# Patient Record
Sex: Female | Born: 1970 | Race: Black or African American | Hispanic: No | Marital: Married | Smoking: Never smoker
Health system: Southern US, Community
[De-identification: ages and names within clinical notes are randomized; demographics above are authoritative.]

## PROBLEM LIST (undated history)

## (undated) DIAGNOSIS — E669 Obesity, unspecified: Secondary | ICD-10-CM

## (undated) DIAGNOSIS — A630 Anogenital (venereal) warts: Secondary | ICD-10-CM

## (undated) DIAGNOSIS — K219 Gastro-esophageal reflux disease without esophagitis: Secondary | ICD-10-CM

## (undated) HISTORY — PX: NERVE SURGERY: SHX1016

## (undated) HISTORY — DX: Gastro-esophageal reflux disease without esophagitis: K21.9

## (undated) HISTORY — DX: Anogenital (venereal) warts: A63.0

## (undated) HISTORY — PX: CYST REMOVAL LEG: SHX6280

## (undated) HISTORY — PX: DIAGNOSTIC LAPAROSCOPY: SUR761

## (undated) HISTORY — DX: Obesity, unspecified: E66.9

---

## 2014-06-20 ENCOUNTER — Other Ambulatory Visit (HOSPITAL_COMMUNITY)
Admission: RE | Admit: 2014-06-20 | Discharge: 2014-06-20 | Disposition: A | Discharge status: Home / Self Care | Payer: BLUE CROSS/BLUE SHIELD | Source: Ambulatory Visit | Attending: Obstetrics & Gynecology | Admitting: Obstetrics & Gynecology

## 2014-06-20 DIAGNOSIS — Z1151 Encounter for screening for human papillomavirus (HPV): Secondary | ICD-10-CM | POA: Diagnosis present

## 2014-06-20 DIAGNOSIS — Z01419 Encounter for gynecological examination (general) (routine) without abnormal findings: Secondary | ICD-10-CM | POA: Diagnosis not present

## 2014-10-01 ENCOUNTER — Other Ambulatory Visit: Payer: Self-pay | Admitting: Internal Medicine

## 2014-10-01 ENCOUNTER — Other Ambulatory Visit: Payer: Self-pay

## 2014-10-01 ENCOUNTER — Ambulatory Visit (HOSPITAL_COMMUNITY): Payer: BLUE CROSS/BLUE SHIELD | Attending: Cardiology

## 2014-10-01 DIAGNOSIS — I517 Cardiomegaly: Secondary | ICD-10-CM | POA: Insufficient documentation

## 2014-10-01 DIAGNOSIS — I071 Rheumatic tricuspid insufficiency: Secondary | ICD-10-CM | POA: Diagnosis not present

## 2014-10-01 DIAGNOSIS — R600 Localized edema: Secondary | ICD-10-CM | POA: Diagnosis present

## 2014-10-01 DIAGNOSIS — I340 Nonrheumatic mitral (valve) insufficiency: Secondary | ICD-10-CM | POA: Insufficient documentation

## 2015-04-13 ENCOUNTER — Emergency Department (HOSPITAL_COMMUNITY): Payer: No Typology Code available for payment source

## 2015-04-13 ENCOUNTER — Encounter (HOSPITAL_COMMUNITY): Payer: Self-pay

## 2015-04-13 ENCOUNTER — Emergency Department (HOSPITAL_COMMUNITY)
Admission: EM | Admit: 2015-04-13 | Discharge: 2015-04-13 | Disposition: A | Discharge status: Home / Self Care | Payer: No Typology Code available for payment source | Attending: Emergency Medicine | Admitting: Emergency Medicine

## 2015-04-13 DIAGNOSIS — S0990XA Unspecified injury of head, initial encounter: Secondary | ICD-10-CM | POA: Diagnosis present

## 2015-04-13 DIAGNOSIS — Y9389 Activity, other specified: Secondary | ICD-10-CM | POA: Diagnosis not present

## 2015-04-13 DIAGNOSIS — S199XXA Unspecified injury of neck, initial encounter: Secondary | ICD-10-CM | POA: Diagnosis not present

## 2015-04-13 DIAGNOSIS — Y92410 Unspecified street and highway as the place of occurrence of the external cause: Secondary | ICD-10-CM | POA: Insufficient documentation

## 2015-04-13 DIAGNOSIS — M542 Cervicalgia: Secondary | ICD-10-CM

## 2015-04-13 DIAGNOSIS — Y998 Other external cause status: Secondary | ICD-10-CM | POA: Insufficient documentation

## 2015-04-13 DIAGNOSIS — S0003XA Contusion of scalp, initial encounter: Secondary | ICD-10-CM

## 2015-04-13 MED ORDER — IBUPROFEN 600 MG PO TABS: 600.0000 mg | ORAL_TABLET | Freq: Four times a day (QID) | ORAL | Status: DC | PRN

## 2015-04-13 MED ORDER — HYDROCODONE-ACETAMINOPHEN 5-325 MG PO TABS: 2.0000 | ORAL_TABLET | ORAL | Status: DC | PRN

## 2015-04-13 MED ORDER — IBUPROFEN 200 MG PO TABS
400.0000 mg | ORAL_TABLET | Freq: Once | ORAL | Status: AC
  Administered 2015-04-13: 400 mg via ORAL
  Filled 2015-04-13: qty 2

## 2015-04-13 MED ORDER — HYDROCODONE-ACETAMINOPHEN 5-325 MG PO TABS
1.0000 | ORAL_TABLET | Freq: Once | ORAL | Status: AC
  Administered 2015-04-13: 1 via ORAL
  Filled 2015-04-13: qty 1

## 2015-04-13 NOTE — Discharge Instructions (Signed)
Contusion A contusion is a deep bruise. Contusions are the result of a blunt injury to tissues and muscle fibers under the skin. The injury causes bleeding under the skin. The skin overlying the contusion may turn blue, purple, or yellow. Minor injuries will give you a painless contusion, but more severe contusions may stay painful and swollen for a few weeks.  CAUSES  This condition is usually caused by a blow, trauma, or direct force to an area of the body. SYMPTOMS  Symptoms of this condition include:  Swelling of the injured area.  Pain and tenderness in the injured area.  Discoloration. The area may have redness and then turn blue, purple, or yellow. DIAGNOSIS  This condition is diagnosed based on a physical exam and medical history. An X-ray, CT scan, or MRI may be needed to determine if there are any associated injuries, such as broken bones (fractures). TREATMENT  Specific treatment for this condition depends on what area of the body was injured. In general, the best treatment for a contusion is resting, icing, applying pressure to (compression), and elevating the injured area. This is often called the RICE strategy. Over-the-counter anti-inflammatory medicines may also be recommended for pain control.  HOME CARE INSTRUCTIONS   Rest the injured area.  If directed, apply ice to the injured area:  Put ice in a plastic bag.  Place a towel between your skin and the bag.  Leave the ice on for 20 minutes, 2-3 times per day.  If directed, apply light compression to the injured area using an elastic bandage. Make sure the bandage is not wrapped too tightly. Remove and reapply the bandage as directed by your health care provider.  If possible, raise (elevate) the injured area above the level of your heart while you are sitting or lying down.  Take over-the-counter and prescription medicines only as told by your health care provider. SEEK MEDICAL CARE IF:  Your symptoms do not  improve after several days of treatment.  Your symptoms get worse.  You have difficulty moving the injured area. SEEK IMMEDIATE MEDICAL CARE IF:   You have severe pain.  You have numbness in a hand or foot.  Your hand or foot turns pale or cold.   This information is not intended to replace advice given to you by your health care provider. Make sure you discuss any questions you have with your health care provider.   Document Released: 01/19/2005 Document Revised: 12/31/2014 Document Reviewed: 08/27/2014 Elsevier Interactive Patient Education 2016 [employer] Cervical Sprain A cervical sprain is an injury in the neck in which the strong, fibrous tissues (ligaments) that connect your neck bones stretch or tear. Cervical sprains can range from mild to severe. Severe cervical sprains can cause the neck vertebrae to be unstable. This can lead to damage of the spinal cord and can result in serious nervous system problems. The amount of time it takes for a cervical sprain to get better depends on the cause and extent of the injury. Most cervical sprains heal in 1 to 3 weeks. CAUSES  Severe cervical sprains may be caused by:   Contact sport injuries (such as from football, rugby, wrestling, hockey, auto racing, gymnastics, diving, martial arts, or boxing).   Motor vehicle collisions.   Whiplash injuries. This is an injury from a sudden forward and backward whipping movement of the head and neck.  Falls.  Mild cervical sprains may be caused by:   Being in an awkward position, such as while  cradling a telephone between your ear and shoulder.   Sitting in a chair that does not offer proper support.   Working at a poorly Landscape architect station.   Looking up or down for long periods of time.  SYMPTOMS   Pain, soreness, stiffness, or a burning sensation in the front, back, or sides of the neck. This discomfort may develop immediately after the injury or slowly, 24 hours or  more after the injury.   Pain or tenderness directly in the middle of the back of the neck.   Shoulder or upper back pain.   Limited ability to move the neck.   Headache.   Dizziness.   Weakness, numbness, or tingling in the hands or arms.   Muscle spasms.   Difficulty swallowing or chewing.   Tenderness and swelling of the neck.  DIAGNOSIS  Most of the time your health care provider can diagnose a cervical sprain by taking your history and doing a physical exam. Your health care provider will ask about previous neck injuries and any known neck problems, such as arthritis in the neck. X-rays may be taken to find out if there are any other problems, such as with the bones of the neck. Other tests, such as a CT scan or MRI, may also be needed.  TREATMENT  Treatment depends on the severity of the cervical sprain. Mild sprains can be treated with rest, keeping the neck in place (immobilization), and pain medicines. Severe cervical sprains are immediately immobilized. Further treatment is done to help with pain, muscle spasms, and other symptoms and may include:  Medicines, such as pain relievers, numbing medicines, or muscle relaxants.   Physical therapy. This may involve stretching exercises, strengthening exercises, and posture training. Exercises and improved posture can help stabilize the neck, strengthen muscles, and help stop symptoms from returning.  HOME CARE INSTRUCTIONS   Put ice on the injured area.   Put ice in a plastic bag.   Place a towel between your skin and the bag.   Leave the ice on for 15-20 minutes, 3-4 times a day.   If your injury was severe, you may have been given a cervical collar to wear. A cervical collar is a two-piece collar designed to keep your neck from moving while it heals.  Do not remove the collar unless instructed by your health care provider.  If you have long hair, keep it outside of the collar.  Ask your health care  provider before making any adjustments to your collar. Minor adjustments may be required over time to improve comfort and reduce pressure on your chin or on the back of your head.  Ifyou are allowed to remove the collar for cleaning or bathing, follow your health care provider's instructions on how to do so safely.  Keep your collar clean by wiping it with mild soap and water and drying it completely. If the collar you have been given includes removable pads, remove them every 1-2 days and hand wash them with soap and water. Allow them to air dry. They should be completely dry before you wear them in the collar.  If you are allowed to remove the collar for cleaning and bathing, wash and dry the skin of your neck. Check your skin for irritation or sores. If you see any, tell your health care provider.  Do not drive while wearing the collar.   Only take over-the-counter or prescription medicines for pain, discomfort, or fever as directed by your health care  provider.   Keep all follow-up appointments as directed by your health care provider.   Keep all physical therapy appointments as directed by your health care provider.   Make any needed adjustments to your workstation to promote good posture.   Avoid positions and activities that make your symptoms worse.   Warm up and stretch before being active to help prevent problems.  SEEK MEDICAL CARE IF:   Your pain is not controlled with medicine.   You are unable to decrease your pain medicine over time as planned.   Your activity level is not improving as expected.  SEEK IMMEDIATE MEDICAL CARE IF:   You develop any bleeding.  You develop stomach upset.  You have signs of an allergic reaction to your medicine.   Your symptoms get worse.   You develop new, unexplained symptoms.   You have numbness, tingling, weakness, or paralysis in any part of your body.  MAKE SURE YOU:   Understand these instructions.  Will  watch your condition.  Will get help right away if you are not doing well or get worse.   This information is not intended to replace advice given to you by your health care provider. Make sure you discuss any questions you have with your health care provider.   Document Released: 02/06/2007 Document Revised: 04/16/2013 Document Reviewed: 10/17/2012 Elsevier Interactive Patient Education [employer].
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
## 2015-04-13 NOTE — ED Notes (Signed)
Patient currently at xray at this time.
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
## 2015-08-19 ENCOUNTER — Ambulatory Visit: Payer: Self-pay | Admitting: Primary Care

## 2015-08-26 ENCOUNTER — Ambulatory Visit (INDEPENDENT_AMBULATORY_CARE_PROVIDER_SITE_OTHER): Payer: Managed Care, Other (non HMO) | Admitting: Primary Care

## 2015-08-26 ENCOUNTER — Encounter: Payer: Self-pay | Admitting: Primary Care

## 2015-08-26 VITALS — BP 112/68 | HR 65 | Temp 98.2°F | Ht 66.0 in | Wt 231.0 lb

## 2015-08-26 DIAGNOSIS — R21 Rash and other nonspecific skin eruption: Secondary | ICD-10-CM

## 2015-08-26 DIAGNOSIS — E669 Obesity, unspecified: Secondary | ICD-10-CM | POA: Diagnosis not present

## 2015-08-26 MED ORDER — TRIAMCINOLONE ACETONIDE 0.1 % EX CREA: 1.0000 "application " | TOPICAL_CREAM | Freq: Two times a day (BID) | CUTANEOUS | Status: DC

## 2015-08-26 NOTE — Patient Instructions (Addendum)
It is important that you improve your diet. Please limit carbohydrates in the form of white bread, rice, pasta, fried foods, sweets, sugary drinks, etc. Increase your consumption of fresh fruits and vegetables, lean protein.  Ensure you are consuming 64 ounces of water daily.  Start exercising. You should be getting 1 hour of moderate intensity exercise 5 days weekly.  Apply the Triamcinolone cream twice daily as needed for rash. Do not apply to your face.   Please schedule a physical with me within the next 3 months. You may also schedule a lab only appointment 3-4 days prior. We will discuss your lab results in detail during your physical.  It was a pleasure to meet you today! Please don't hesitate to call me with any questions. Welcome to [facility]!

## 2015-08-26 NOTE — Progress Notes (Signed)
Pre visit review using our clinic review tool, if applicable. No additional management support is needed unless otherwise documented below in the visit note. 

## 2015-08-26 NOTE — Assessment & Plan Note (Signed)
Struggled with for most of her life. Poor diet and does not exercise. Discussed that I do not prescribe weight loss meds and provided her with names of places that do. Recommendations and handouts provided today in regards to healthy diet and exercise. Will continue to monitor.

## 2015-08-26 NOTE — Assessment & Plan Note (Signed)
Located to left lower extremity intermittently x 1 year, also dry scalp.  No improvement with OTC. RX for Triamcinolone cream sent to pharmacy for BID application.  Will continue to monitor.

## 2015-08-26 NOTE — Progress Notes (Signed)
Subjective:    Patient ID: Alicia Barton, female    DOB: April 07, 1971, 45 y.o.   MRN: [id number]  HPI  [name] is a 45 year old female who presents today to establish care and discuss the problems mentioned below. Will obtain old records. Her last physical in Spring 2016.  1) Obesity: Struggled with her weight her entire life. She's taken diet pills and drops in the past with temporary improvement until regimen is complete. She has gained her weight back each time.  She endorses a healthy diet: Breakfast: Pancake, bacon, egg Lunch: Tuna and crackers, sandwich Dinner: Pasta, salads, fried chicken, rice, mac and cheese, vegetable Snacks: Chips, cookies, candies, pretzels, fast food Desserts: Daily Beverages: Water, sweet tea  Exercise: She is currently not exercising  2) Rash: Intermittent for the past 1 year. Located to the left lower extremity and is itchy in nature. Denies scaling, open wounds, swelling. She does have a dry scalp.   3) Hyperlipidemia: Prior history of several years ago. She's never been managed on medication. Her last lipid panel was normal 1 year ago.   4) Back Pain: Located to left lower back. Present intermittently for years with radiation of pain down her left lower extremity. Worse with weight weight gain. She underwent MRI several years ago which was normal per patient. Also with left lower extremity swelling. She's had work up for left lower extremity swelling which was inconclusive.   Review of Systems  Constitutional: Negative for unexpected weight change.  Respiratory: Negative for shortness of breath.   Cardiovascular: Negative for chest pain.  Gastrointestinal:       Occasional reflux  Genitourinary:       Irregular periods  Skin: Positive for rash.  Neurological: Negative for dizziness and headaches.       Past Medical History  Diagnosis Date  . GERD (gastroesophageal reflux disease)   . Hyperlipidemia   . Genital warts      Social  History   Social History  . Marital Status: Married    Spouse Name: N/A  . Number of Children: N/A  . Years of Education: N/A   Occupational History  . Not on file.   Social History Main Topics  . Smoking status: Never Smoker   . Smokeless tobacco: Not on file  . Alcohol Use: 0.0 oz/week    0 Standard drinks or equivalent per week     Comment: socially  . Drug Use: No  . Sexual Activity: Not on file   Other Topics Concern  . Not on file   Social History Narrative   Married.   2 daughters   Works as a Corporate treasurer.   Enjoys shopping, being outdoors.     Past Surgical History  Procedure Laterality Date  . Cesarean section    . Cyst removal leg      R lower  . R arm pinched nerve     . Abdominal surgery      ileus    Family History  Problem Relation Age of Onset  . Arthritis Mother   . Hyperlipidemia Mother   . Diabetes Mother   . Emphysema Father     No Known Allergies  No current outpatient prescriptions on file prior to visit.   No current facility-administered medications on file prior to visit.    BP 112/68 mmHg  Pulse 65  Temp(Src) 98.2 F (36.8 C) (Oral)  Ht 5' 6"  (1.676 m)  Wt 231 lb (104.781 kg)  BMI 37.30  kg/m2  SpO2 98%  LMP 08/01/2015    Objective:   Physical Exam  Constitutional: She appears well-nourished.  Neck: Neck supple.  Cardiovascular: Normal rate and regular rhythm.   Pulmonary/Chest: Effort normal and breath sounds normal.  Skin: Skin is warm and dry.  Psychiatric: She has a normal mood and affect.          Assessment & Plan:

## 2015-11-25 ENCOUNTER — Encounter: Payer: Self-pay | Admitting: Primary Care

## 2015-11-25 ENCOUNTER — Ambulatory Visit (INDEPENDENT_AMBULATORY_CARE_PROVIDER_SITE_OTHER): Payer: Managed Care, Other (non HMO) | Admitting: Primary Care

## 2015-11-25 VITALS — BP 118/74 | HR 72 | Temp 97.5°F | Ht 66.0 in | Wt 236.4 lb

## 2015-11-25 DIAGNOSIS — E669 Obesity, unspecified: Secondary | ICD-10-CM | POA: Diagnosis not present

## 2015-11-25 DIAGNOSIS — Z0000 Encounter for general adult medical examination without abnormal findings: Secondary | ICD-10-CM | POA: Insufficient documentation

## 2015-11-25 DIAGNOSIS — Z1239 Encounter for other screening for malignant neoplasm of breast: Secondary | ICD-10-CM

## 2015-11-25 LAB — COMPREHENSIVE METABOLIC PANEL
ALT: 11 U/L (ref 0–35)
AST: 14 U/L (ref 0–37)
Albumin: 4.1 g/dL (ref 3.5–5.2)
Alkaline Phosphatase: 40 U/L (ref 39–117)
BUN: 9 mg/dL (ref 6–23)
CALCIUM: 9.5 mg/dL (ref 8.4–10.5)
CHLORIDE: 101 meq/L (ref 96–112)
CO2: 28 mEq/L (ref 19–32)
Creatinine, Ser: 0.71 mg/dL (ref 0.40–1.20)
GFR: 115.31 mL/min (ref >60.00)
Glucose, Bld: 83 mg/dL (ref 70–99)
POTASSIUM: 3.8 meq/L (ref 3.5–5.1)
SODIUM: 135 meq/L (ref 135–145)
Total Bilirubin: 1 mg/dL (ref 0.2–1.2)
Total Protein: 7.6 g/dL (ref 6.0–8.3)

## 2015-11-25 LAB — VITAMIN D 25 HYDROXY (VIT D DEFICIENCY, FRACTURES): VITD: 17.06 ng/mL — ABNORMAL LOW (ref 30.00–100.00)

## 2015-11-25 LAB — LIPID PANEL
CHOL/HDL RATIO: 3
Cholesterol: 158 mg/dL (ref 0–200)
HDL: 52.6 mg/dL (ref >39.00)
LDL Cholesterol: 75 mg/dL (ref 0–99)
NONHDL: 105.02
Triglycerides: 149 mg/dL (ref 0.0–149.0)
VLDL: 29.8 mg/dL (ref 0.0–40.0)

## 2015-11-25 LAB — HEMOGLOBIN A1C: Hgb A1c MFr Bld: 5.7 % (ref 4.6–6.5)

## 2015-11-25 LAB — CBC
HCT: 38.3 % (ref 36.0–46.0)
Hemoglobin: 12.5 g/dL (ref 12.0–15.0)
MCHC: 32.6 g/dL (ref 30.0–36.0)
MCV: 83.8 fl (ref 78.0–100.0)
Platelets: 240 10*3/uL (ref 150.0–400.0)
RBC: 4.57 Mil/uL (ref 3.87–5.11)
RDW: 13.6 % (ref 11.5–15.5)
WBC: 5.9 10*3/uL (ref 4.0–10.5)

## 2015-11-25 LAB — TSH: TSH: 1.33 u[IU]/mL (ref 0.35–4.50)

## 2015-11-25 NOTE — Progress Notes (Signed)
Subjective:    Patient ID: Alicia Barton, female    DOB: 09-30-1970, 45 y.o.   MRN: [id number]  HPI  Ms. Bynum is a 45 year old female who presents today for complete physical.  Immunizations: -Tetanus: Completed in Summer 2016 -Influenza: Did not complete last season  Diet: She endorses a poor diet. Breakfast: Egg, toast, cereal, applesauce Lunch: Tuna, crackers, peanut butter sandwich Dinner: Fast food, cereal, pizza Snacks: Chips, cakes, candy, chocolate Desserts: Daily Beverages: Water, occasional soda  Exercise: She does not currently exercise Eye exam: Completed in 2016, no changes in vision Dental exam: Completes semi-annually Pap Smear: Completed in 2016, normal Mammogram: Has not completed in several years.    Review of Systems  Constitutional: Negative for unexpected weight change.  HENT: Negative for rhinorrhea.   Respiratory: Negative for cough and shortness of breath.   Cardiovascular: Negative for chest pain.  Gastrointestinal: Negative for constipation and diarrhea.  Genitourinary: Negative for difficulty urinating.       Irregular periods for years.  Musculoskeletal: Negative for arthralgias and myalgias.       Lower back pain when waking in the morning  Skin: Negative for rash.  Allergic/Immunologic: Negative for environmental allergies.  Neurological: Negative for dizziness, numbness and headaches.  Psychiatric/Behavioral:       Denies concerns for anxiety or depression       Past Medical History:  Diagnosis Date  . Genital warts   . GERD (gastroesophageal reflux disease)   . Hyperlipidemia   . Obesity (BMI 35.0-39.9 without comorbidity) ([place])      Social History   Social History  . Marital status: Married    Spouse name: N/A  . Number of children: N/A  . Years of education: N/A   Occupational History  . Not on file.   Social History Main Topics  . Smoking status: Never Smoker  . Smokeless tobacco: Not on file  . Alcohol use 0.0  oz/week     Comment: socially  . Drug use: No  . Sexual activity: Not on file   Other Topics Concern  . Not on file   Social History Narrative   Married.   2 daughters   Works as a Corporate treasurer.   Enjoys shopping, being outdoors.     Past Surgical History:  Procedure Laterality Date  . ABDOMINAL SURGERY     ileus  . CESAREAN SECTION    . CYST REMOVAL LEG     R lower  . R arm pinched nerve       Family History  Problem Relation Age of Onset  . Arthritis Mother   . Hyperlipidemia Mother   . Diabetes Mother   . Emphysema Father     No Known Allergies  No current outpatient prescriptions on file prior to visit.   No current facility-administered medications on file prior to visit.     BP 118/74   Pulse 72   Temp 97.5 F (36.4 C) (Oral)   Ht 5' 6"  (1.676 m)   Wt 236 lb 6.4 oz (107.2 kg)   LMP 10/31/2015   SpO2 95%   BMI 38.16 kg/m    Objective:   Physical Exam  Constitutional: She is oriented to person, place, and time. She appears well-nourished.  HENT:  Right Ear: Tympanic membrane and ear canal normal.  Left Ear: Tympanic membrane and ear canal normal.  Nose: Nose normal.  Mouth/Throat: Oropharynx is clear and moist.  Eyes: Conjunctivae and EOM are normal. Pupils are equal,  round, and reactive to light.  Neck: Neck supple. No thyromegaly present.  Cardiovascular: Normal rate and regular rhythm.   No murmur heard. Mild left lower extremity/ankle edema. No pitting.  Pulmonary/Chest: Effort normal and breath sounds normal. She has no rales.  Abdominal: Soft. Bowel sounds are normal. There is no tenderness.  Musculoskeletal: Normal range of motion.  Negative straight leg raise. Good range of motion to lower back bilaterally.  Lymphadenopathy:    She has no cervical adenopathy.  Neurological: She is alert and oriented to person, place, and time. She has normal reflexes. No cranial nerve deficit.  Skin: Skin is warm and dry. No rash noted.  Psychiatric: She has  a normal mood and affect.          Assessment & Plan:

## 2015-11-25 NOTE — Progress Notes (Signed)
Pre visit review using our clinic review tool, if applicable. No additional management support is needed unless otherwise documented below in the visit note. 

## 2015-11-25 NOTE — Assessment & Plan Note (Signed)
Poor diet and does not exercise. Discussed the importance of a healthy diet and regular exercise in order for weight loss and to reduce risk of other medical diseases.

## 2015-11-25 NOTE — Assessment & Plan Note (Signed)
Immunizations up-to-date. Pap up-to-date per patient. Mammogram due and ordered today. Exam unremarkable. Labs pending. Discussed the importance of a healthy diet and regular exercise in order for weight loss and to reduce risk of other medical diseases.  Follow-up in one year for repeat physical.

## 2015-11-25 NOTE — Patient Instructions (Signed)
Complete lab work prior to leaving today. I will notify you of your results once received.   I placed the order for your mammogram at [facility] through [facility].  It is important that you improve your diet. Please limit carbohydrates in the form of white bread, rice, pasta, cakes, fast food, sugary drinks, etc. Increase your consumption of fresh fruits and vegetables, whole grains, lean protein.  Ensure you are consuming 64 ounces of water daily.  Start exercising. You should be getting 1 hour of moderate intensity exercise 3-4 days weekly.  Follow up in 1 year for repeat physical or sooner if needed.  It was a pleasure to see you today!

## 2015-11-26 ENCOUNTER — Encounter: Payer: Self-pay | Admitting: *Deleted

## 2017-05-08 ENCOUNTER — Encounter: Payer: Self-pay | Admitting: Occupational Therapy

## 2017-05-08 ENCOUNTER — Other Ambulatory Visit: Payer: Self-pay

## 2017-05-08 ENCOUNTER — Ambulatory Visit: Payer: Self-pay | Attending: Orthopedic Surgery | Admitting: Occupational Therapy

## 2017-05-08 DIAGNOSIS — M79641 Pain in right hand: Secondary | ICD-10-CM

## 2017-05-08 DIAGNOSIS — R600 Localized edema: Secondary | ICD-10-CM

## 2017-05-08 DIAGNOSIS — M25641 Stiffness of right hand, not elsewhere classified: Secondary | ICD-10-CM

## 2017-05-08 DIAGNOSIS — M6281 Muscle weakness (generalized): Secondary | ICD-10-CM
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
## 2017-05-08 NOTE — Patient Instructions (Signed)
Contrast  AROM for R thumb PA and RA AROM block IP flexion  Gentle composite PROM for thumb  Opposition AROM with end range gentle stretch  10 reps each  Pain to keep under 2/10 and slight pull   Thumb CMC neoprene fitted on pt to use during day  And compression digi sleeve for night time use

## 2017-05-11 ENCOUNTER — Ambulatory Visit: Payer: Self-pay | Admitting: Occupational Therapy

## 2017-05-11 DIAGNOSIS — M79641 Pain in right hand: Secondary | ICD-10-CM

## 2017-05-11 DIAGNOSIS — M6281 Muscle weakness (generalized): Secondary | ICD-10-CM

## 2017-05-11 DIAGNOSIS — R600 Localized edema: Secondary | ICD-10-CM

## 2017-05-11 DIAGNOSIS — M25641 Stiffness of right hand, not elsewhere classified: Secondary | ICD-10-CM

## 2017-05-11 NOTE — Therapy (Signed)
[place] [facility] [address] [place], [address] Phone: [phone]   Fax:  [phone]  Occupational Therapy Treatment  Patient Details  Name: Alicia Barton MRN: [id number] Date of Birth: December 08, 1970 Referring Provider: Rudene Christians   Encounter Date: 05/11/2017  OT End of Session - 05/11/17 0957    Visit Number  2    Number of Visits  6    Date for OT Re-Evaluation  06/12/17    OT Start Time  0955    OT Stop Time  1028    OT Time Calculation (min)  33 min    Activity Tolerance  Patient tolerated treatment well    Behavior During Therapy  [facility] for tasks assessed/performed       Past Medical History:  Diagnosis Date  . Genital warts   . GERD (gastroesophageal reflux disease)   . Hyperlipidemia   . Obesity (BMI 35.0-39.9 without comorbidity)     Past Surgical History:  Procedure Laterality Date  . ABDOMINAL SURGERY     ileus  . CESAREAN SECTION    . CYST REMOVAL LEG     R lower  . R arm pinched nerve       There were no vitals filed for this visit.  Subjective Assessment - 05/11/17 0948    Subjective   Since last night had more pain - exercises doing okay - using it more - sleep with soft splint and furing day using my hand     Patient Stated Goals  I want to get the use of my R thumb back so I can go back to work , do buttons, tie shoes, do my and my daughters hair, pick up or lift objects     Currently in Pain?  Yes    Pain Score  3     Pain Location  Finger (Comment which one)    Pain Orientation  Right    Pain Descriptors / Indicators  Aching    Pain Type  Acute pain    Pain Onset  More than a month ago         [facility] OT Assessment - 05/11/17 0001      Edema   Edema  0.6 increase      Right Hand AROM   R Thumb MCP 0-60  40 Degrees    R Thumb IP 0-80  40 Degrees    R Thumb Radial ABduction/ADduction 0-55  56    R Thumb Palmar ABduction/ADduction 0-45  54    R Thumb Opposition to Index  --  Opposition to distal fold of 5th                OT Treatments/Exercises (OP) - 05/11/17 0001      Ultrasound   Ultrasound Location  thumb IP and MP     Ultrasound Parameters  3.3MHZ , 20% , .8 intensity     Ultrasound Goals  Edema;Pain      RUE Fluidotherapy   Number Minutes Fluidotherapy  10 Minutes    RUE Fluidotherapy Location  Hand    Comments  At [facility] to decrease pain and increase ROM        Assess active range of motion of right thumb IP and MP flexion and opposition  Pt cont to have some pain at thumb 2-3/10 after fluidotherapy pain decrease and increase range of motion review with patients home exercises for blocked active range of motion IP thumb, and MP Pt  to cont with same HEP but a gentle passive range of motion with Oppostion to 5th  review and added to home exercises for isometric for thumb PA and RA, flexion and extention in neutral position  Use neoprene CMC thumb splint during act to decrease pain  Do contrast prior to HEP and ice several times           OT Education - 05/11/17 2011    Education provided  Yes       OT Short Term Goals - 05/08/17 1145      OT SHORT TERM GOAL #1   Title  Pain on PRWHE improve with 20 points     Baseline  Pain at eval on PRWHE is 33/50    Time  3    Period  Weeks    Status  New    Target Date  05/29/17      OT SHORT TERM GOAL #2   Title  R thumb AROM improve for pt to touch palm to retrieve  1-2 cm objects out of palm     Baseline  see flowsheet for AROM of thumb     Time  3    Period  Weeks    Status  New    Target Date  05/29/17      OT SHORT TERM GOAL #3   Title  Pt to be ind in HEP to decrease edema and pain - and increase ROM to return to work     Baseline  no knowledge on HEP - edema increase 1 cm     Time  3    Period  Weeks    Status  New    Target Date  05/29/17        OT Long Term Goals - 05/08/17 1147      OT LONG TERM GOAL #1   Title  AROM WFL  and pain decrease for pt to initiate  strengthening to be able to do buttons, tie shoes and do hair    Baseline  pain increase 6/10 , AROM decrease - and not able to put pressure on thumb     Time  4    Period  Weeks    Status  New    Target Date  06/05/17      OT LONG TERM GOAL #2   Title  R prehension and grip strength increase to more than 50% compare to L hand     Baseline  NA yet- 6 wks -only 1 wk out of thumb spica -and pain increase 6/10 with pressure or use     Time  5    Period  Weeks    Status  New    Target Date  06/12/17            Plan - 05/11/17 0957    Clinical Impression Statement  Pt made progress in AROM of R  thumb and use - but still has pain if over do things , and cont to wear neoprene splint with act that cause pain - gentle on PROM - did add this date isometric strengnth  in painfree range - pt cont to increase ROM , use but decrease pain too     Occupational performance deficits (Please refer to evaluation for details):  ADL's;IADL's;Work;Play;Leisure    Rehab Potential  Good    OT Frequency  2x / week    OT Duration  4 weeks    OT Treatment/Interventions  Self-care/ADL training;Fluidtherapy;Splinting;Contrast Bath;Therapeutic  exercise;Ultrasound;Paraffin;Manual Therapy;Passive range of motion    Plan  assess if pain decrease - AROM progresing and if can do more strength     Clinical Decision Making  Several treatment options, min-mod task modification necessary    OT Home Exercise Plan  see pt instruction     Consulted and Agree with Plan of Care  Patient       Patient will benefit from skilled therapeutic intervention in order to improve the following deficits and impairments:  Pain, Impaired flexibility, Increased edema, Decreased coordination, Decreased strength, Decreased range of motion, Impaired UE functional use  Visit Diagnosis: Pain in right hand  Localized edema  Stiffness of right hand, not elsewhere classified  Muscle weakness (generalized)    Problem List Patient  Active Problem List   Diagnosis Date Noted  . Preventative health care 11/25/2015  . Rash and nonspecific skin eruption 08/26/2015  . Obesity (BMI 35.0-39.9 without comorbidity) 08/26/2015    Rosalyn Gess OTR/L,CLT 05/11/2017, 8:15 PM  [place] [facility] [address] [place], [address] Phone: [phone]   Fax:  [phone]  Name: Alicia Barton MRN: [id number] Date of Birth: 1970-07-26

## 2017-05-11 NOTE — Patient Instructions (Addendum)
cont at home  Contrast   AROM for thumb PA , RA , blocked AROM IP flexion  AAROM thumb composite flexion  Oppposition to all digits - AAROM to 2nd fold of opposition  CMC neoprene splint wear during act that increase pain   digisleeve for night time to decrease edema  Wear if it decrease pain  Add isometric thumb in neutral Flexion, extention, , PA and RA  10 reps pain free 2 sets  Ice massage and ice as needed

## 2017-05-17 ENCOUNTER — Ambulatory Visit: Payer: Self-pay | Admitting: Occupational Therapy

## 2017-05-17 DIAGNOSIS — R600 Localized edema: Secondary | ICD-10-CM

## 2017-05-17 DIAGNOSIS — M25641 Stiffness of right hand, not elsewhere classified: Secondary | ICD-10-CM

## 2017-05-17 DIAGNOSIS — M79641 Pain in right hand: Secondary | ICD-10-CM

## 2017-05-17 DIAGNOSIS — M6281 Muscle weakness (generalized): Secondary | ICD-10-CM

## 2017-05-17 NOTE — Therapy (Signed)
[place] [facility] [address] [place], [address] Phone: [phone]   Fax:  [phone]  Occupational Therapy Treatment  Patient Details  Name: Alicia Barton MRN: [id number] Date of Birth: March 30, 1971 Referring Provider: Rudene Christians   Encounter Date: 05/17/2017  OT End of Session - 05/17/17 0932    Visit Number  3    Number of Visits  6    Date for OT Re-Evaluation  06/12/17    OT Start Time  0918    OT Stop Time  1001    OT Time Calculation (min)  43 min    Activity Tolerance  Patient tolerated treatment well    Behavior During Therapy  [facility] for tasks assessed/performed       Past Medical History:  Diagnosis Date  . Genital warts   . GERD (gastroesophageal reflux disease)   . Hyperlipidemia   . Obesity (BMI 35.0-39.9 without comorbidity)     Past Surgical History:  Procedure Laterality Date  . ABDOMINAL SURGERY     ileus  . CESAREAN SECTION    . CYST REMOVAL LEG     R lower  . R arm pinched nerve       There were no vitals filed for this visit.  Subjective Assessment - 05/17/17 0928    Subjective   Pain is better, did wear the soft splint during day about 80% of time - I think swelling is little better- can use my hand better - like bathing and hygiene    Patient Stated Goals  I want to get the use of my R thumb back so I can go back to work , do buttons, tie shoes, do my and my daughters hair, pick up or lift objects     Currently in Pain?  No/denies         [facility] OT Assessment - 05/17/17 0001      Strength   Right Hand Grip (lbs)  22    Left Hand Grip (lbs)  85      Right Hand AROM   R Thumb IP 0-80  45 Degrees    R Thumb Radial ABduction/ADduction 0-55  55 no pain     R Thumb Palmar ABduction/ADduction 0-45  55 no pain                OT Treatments/Exercises (OP) - 05/17/17 0001      RUE Fluidotherapy   Number Minutes Fluidotherapy  10 Minutes    RUE Fluidotherapy Location   Hand    Comments  at [facility] to decrease stiffness and decrease pain        Assess active range of motion of right thumb IP and MP flexion and opposition  PA and RA of thumb   Pt cont to have some pain at thumb 2-3/10 with pressure or use  after fluidotherapy pain decrease and increase range of motion  review with patients home exercises for blocked active range of motion IP thumb, and MP - can add gentle PROM to thumb IP  Increase 10 degrees    review  isometric for thumb PA and RA, flexion and extention in neutral position   pt to stop and do rubber band for Thumb PA and RA 10 reps  Light blue easy putty - for gripping done this date- no pain - upgrade to teal - had some pain but it was after about 20-25 reps   pt to do light blue for 3  days - and if no pain can do teal - 12 reps  2 x day  Use neoprene CMC thumb splint during act to decrease pain  And try digi compression sleeve for thumb again to decrease edema - was increase .8 cm this date  Do contrast prior to HEP and ice several times    add gentle PROM for wrist flexion and exention appear to have full motion but report pull at end range and after putty      OT Education - 05/17/17 0931    Education provided  Yes    Education Details  HEP -  strengthening for thumb and grip     Person(s) Educated  Patient    Methods  Explanation;Demonstration;Tactile cues;Verbal cues    Comprehension  Verbalized understanding;Returned demonstration;Verbal cues required       OT Short Term Goals - 05/08/17 1145      OT SHORT TERM GOAL #1   Title  Pain on PRWHE improve with 20 points     Baseline  Pain at eval on PRWHE is 33/50    Time  3    Period  Weeks    Status  New    Target Date  05/29/17      OT SHORT TERM GOAL #2   Title  R thumb AROM improve for pt to touch palm to retrieve  1-2 cm objects out of palm     Baseline  see flowsheet for AROM of thumb     Time  3    Period  Weeks    Status  New    Target Date  05/29/17       OT SHORT TERM GOAL #3   Title  Pt to be ind in HEP to decrease edema and pain - and increase ROM to return to work     Baseline  no knowledge on HEP - edema increase 1 cm     Time  3    Period  Weeks    Status  New    Target Date  05/29/17        OT Long Term Goals - 05/08/17 1147      OT LONG TERM GOAL #1   Title  AROM WFL  and pain decrease for pt to initiate strengthening to be able to do buttons, tie shoes and do hair    Baseline  pain increase 6/10 , AROM decrease - and not able to put pressure on thumb     Time  4    Period  Weeks    Status  New    Target Date  06/05/17      OT LONG TERM GOAL #2   Title  R prehension and grip strength increase to more than 50% compare to L hand     Baseline  NA yet- 6 wks -only 1 wk out of thumb spica -and pain increase 6/10 with pressure or use     Time  5    Period  Weeks    Status  New    Target Date  06/12/17            Plan - 05/17/17 0933    Clinical Impression Statement  Pt's pain was decrease this date and report increase use - AROM in thumb improve this date in session and was able to add strengthenign but pt still with .8 cm edema in thumb - to try more compression -     Occupational performance deficits (Please  refer to evaluation for details):  ADL's;IADL's;Work;Play;Leisure    Rehab Potential  Good    OT Frequency  2x / week    OT Treatment/Interventions  Self-care/ADL training;Fluidtherapy;Splinting;Contrast Bath;Therapeutic exercise;Ultrasound;Paraffin;Manual Therapy;Passive range of motion    Clinical Decision Making  Several treatment options, min-mod task modification necessary    OT Home Exercise Plan  see pt instruction     Consulted and Agree with Plan of Care  Patient       Patient will benefit from skilled therapeutic intervention in order to improve the following deficits and impairments:  Pain, Impaired flexibility, Increased edema, Decreased coordination, Decreased strength, Decreased range of motion,  Impaired UE functional use  Visit Diagnosis: Pain in right hand  Localized edema  Stiffness of right hand, not elsewhere classified  Muscle weakness (generalized)    Problem List Patient Active Problem List   Diagnosis Date Noted  . Preventative health care 11/25/2015  . Rash and nonspecific skin eruption 08/26/2015  . Obesity (BMI 35.0-39.9 without comorbidity) 08/26/2015    Rosalyn Gess OTR/L,CLT 05/17/2017, 3:29 PM  [place] [facility] [address] [place], [address] Phone: [phone]   Fax:  [phone]  Name: Mansi Tokar MRN: [id number] Date of Birth: [birth year]

## 2017-05-17 NOTE — Patient Instructions (Addendum)
Same HEP but add     do rubber band for Thumb PA and RA 10 reps  Light blue easy putty - for gripping done this date- no pain - upgrade to teal - had some pain but it was after about 20-25 reps   pt to do light blue for 3 days - and if no pain can do teal - 12 reps  2 x day  Use neoprene CMC thumb splint during act to decrease pain  And try digi compression sleeve for thumb again to decrease edema - was increase .8 cm this date  Do contrast prior to HEP and ice several times

## 2017-05-24 ENCOUNTER — Ambulatory Visit: Payer: Self-pay | Admitting: Occupational Therapy

## 2017-05-24 DIAGNOSIS — M25641 Stiffness of right hand, not elsewhere classified: Secondary | ICD-10-CM

## 2017-05-24 DIAGNOSIS — R600 Localized edema: Secondary | ICD-10-CM

## 2017-05-24 DIAGNOSIS — M79641 Pain in right hand: Secondary | ICD-10-CM

## 2017-05-24 DIAGNOSIS — M6281 Muscle weakness (generalized): Secondary | ICD-10-CM

## 2017-05-24 NOTE — Therapy (Signed)
[place] [facility] [address], [place], [address] Phone: [phone]   Fax:  [phone]  Occupational Therapy Treatment  Patient Details  Name: Merion Caton MRN: [id number] Date of Birth: 1971/03/02 Referring Provider: Rudene Christians   Encounter Date: 05/24/2017  OT End of Session - 05/24/17 0936    Visit Number  4    Number of Visits  6    Date for OT Re-Evaluation  06/12/17    OT Start Time  0920    OT Stop Time  1000    OT Time Calculation (min)  40 min    Activity Tolerance  Patient tolerated treatment well    Behavior During Therapy  [facility] for tasks assessed/performed       Past Medical History:  Diagnosis Date  . Genital warts   . GERD (gastroesophageal reflux disease)   . Hyperlipidemia   . Obesity (BMI 35.0-39.9 without comorbidity)     Past Surgical History:  Procedure Laterality Date  . ABDOMINAL SURGERY     ileus  . CESAREAN SECTION    . CYST REMOVAL LEG     R lower  . R arm pinched nerve       There were no vitals filed for this visit.  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  No/denies         [facility] OT Assessment - 05/24/17 0001      Strength   Right Hand Grip (lbs)  51    Left Hand Grip (lbs)  85      Right Hand AROM   R Thumb IP 0-80  40 Degrees       Assess AROM for thumb and grip strength - grip increase significantly - did not address prehension  strength yet - to much pain wit pressure  But this date did dexterity with pt - thumb <> fingers act - like cards Tripod grip and walk back and forth on cylinder objects Pick up small object of 1 cm - the 2 point grip - and move objects to palm  - up to 3 and release one at time to 2 point grip  Add to HEP  Pt to cont with contrast and  Done blocked AROM for IP and MP of thumb  And opposition to 2nd fold of 5th   pt hold of on putty  And increase use functionally -and simulate work act   pt to try on suction for trachea pt's block hole with 2nd digit  get spring loaded scissors and Penagain to decrease pressure on thumb and decrease pain           OT Treatments/Exercises (OP) - 05/24/17 0001      RUE  Paraffin   Number Minutes Paraffin  10 Minutes    RUE Paraffin Location  Hand    Comments  decrease pain after measurements              OT Education - 05/24/17 1817    Education provided  Yes    Education Details  HEP change -add dexterity     Methods  Explanation;Demonstration;Tactile cues;Verbal cues;Handout    Comprehension  Verbalized understanding;Returned demonstration       OT Short Term Goals - 05/08/17 1145      OT SHORT TERM GOAL #1   Title  Pain on PRWHE improve with 20 points     Baseline  Pain at eval on PRWHE is 33/50    Time  3    Period  Weeks    Status  New    Target Date  05/29/17      OT SHORT TERM GOAL #2   Title  R thumb AROM improve for pt to touch palm to retrieve  1-2 cm objects out of palm     Baseline  see flowsheet for AROM of thumb     Time  3    Period  Weeks    Status  New    Target Date  05/29/17      OT SHORT TERM GOAL #3   Title  Pt to be ind in HEP to decrease edema and pain - and increase ROM to return to work     Baseline  no knowledge on HEP - edema increase 1 cm     Time  3    Period  Weeks    Status  New    Target Date  05/29/17        OT Long Term Goals - 05/08/17 1147      OT LONG TERM GOAL #1   Title   AROM WFL  and pain decrease for pt to initiate strengthening to be able to do buttons, tie shoes and do hair    Baseline  pain increase 6/10 , AROM decrease - and not able to put pressure on thumb     Time  4    Period  Weeks    Status  New    Target Date  06/05/17      OT LONG TERM GOAL #2   Title  R prehension and grip strength increase to more than 50% compare to L hand     Baseline  NA yet- 6 wks -only 1 wk out of thumb spica -and pain increase 6/10 with pressure or use     Time  5    Period  Weeks    Status  New    Target Date  06/12/17            Plan - 05/24/17 9242    Clinical Impression Statement  Pt show increase AROM in thumb , increase grip strength and pain this date was decrease since she over did it over the weekend - pt to work until appt with MD on AROM , dexterity and keeping pain under 2/10 - and she is worried about going back to work - pt to simulate act  this week to discuss with MD Monday - 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 and all will be new ones she is not familiar with     Occupational performance deficits (Please refer to evaluation for details):  ADL's;IADL's;Work;Play;Leisure    Rehab Potential  Good  OT Frequency  2x / week    OT Duration  4 weeks    OT Treatment/Interventions  Self-care/ADL training;Fluidtherapy;Splinting;Contrast Bath;Therapeutic exercise;Ultrasound;Paraffin;Manual Therapy;Passive range of motion    Plan  check how did with dexterity HEP and ROM , strenght - without increase pain     Clinical Decision Making  Several treatment options, min-mod task modification necessary    OT Home Exercise Plan  see pt instruction     Consulted and Agree with Plan of Care  Patient       Patient will benefit from skilled therapeutic intervention in order to improve the following deficits and impairments:  Pain, Impaired flexibility, Increased edema, Decreased coordination, Decreased strength, Decreased range of motion, Impaired UE functional  use  Visit Diagnosis: Pain in right hand  Localized edema  Stiffness of right hand, not elsewhere classified  Muscle weakness (generalized)    Problem List Patient Active Problem List   Diagnosis Date Noted  . Preventative health care 11/25/2015  . Rash and nonspecific skin eruption 08/26/2015  . Obesity (BMI 35.0-39.9 without comorbidity) 08/26/2015    Rosalyn Gess OTR/L,CLT 05/24/2017, 6:20 PM  [place] [facility] [address], [place], [address] Phone: [phone]   Fax:  [phone]  Name: Tegan Britain MRN: [id number] Date of Birth: 04/28/1970

## 2017-05-24 NOTE — Patient Instructions (Signed)
Pt to only do AROM after contrast for thumb in all planes And then add this date dexterity for functional use - to increase ROM at thumb , speed  And to do functional strength for prehension strength  Pt to try and simulate act of work to discuss it with MD on MOnday

## 2017-05-31 ENCOUNTER — Ambulatory Visit: Payer: Self-pay | Attending: Orthopedic Surgery | Admitting: Occupational Therapy

## 2017-05-31 DIAGNOSIS — M6281 Muscle weakness (generalized): Secondary | ICD-10-CM

## 2017-05-31 DIAGNOSIS — M25641 Stiffness of right hand, not elsewhere classified: Secondary | ICD-10-CM

## 2017-05-31 DIAGNOSIS — R600 Localized edema: Secondary | ICD-10-CM

## 2017-05-31 DIAGNOSIS — M79641 Pain in right hand: Secondary | ICD-10-CM

## 2017-05-31 NOTE — Patient Instructions (Signed)
Pt to work only on AROM for opposition to base of 5th  AROM for thumb PA and RA  and then functional strength-   OT discuss in length with pt about modifications and adjustments for work activities  - suction , diaper changes, rolling , transfers  , NG tube feedings ,   Keep pain under 1-2/10

## 2017-05-31 NOTE — Therapy (Signed)
[place] [facility] [address], [place], [address] Phone: [phone]   Fax:  [phone]  Occupational Therapy Treatment  Patient Details  Name: Alicia Barton MRN: [id number] Date of Birth: Sep 17, 1970 Referring Provider: Rudene Christians   Encounter Date: 05/31/2017  OT End of Session - 05/31/17 1734    Visit Number  5    Number of Visits  6    Date for OT Re-Evaluation  06/12/17    OT Start Time  1000    OT Stop Time  1035    OT Time Calculation (min)  35 min    Activity Tolerance  Patient tolerated treatment well    Behavior During Therapy  [facility] for tasks assessed/performed       Past Medical History:  Diagnosis Date  . Genital warts   . GERD (gastroesophageal reflux disease)   . Hyperlipidemia   . Obesity (BMI 35.0-39.9 without comorbidity)     Past Surgical History:  Procedure Laterality Date  . ABDOMINAL SURGERY     ileus  . CESAREAN SECTION    . CYST REMOVAL LEG     R lower  . R arm pinched nerve       There were no vitals filed for this visit.  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  No/denies      Assess AROM for thumb PA and RA - 1/10 pain with RA - at end range  IP flexion block no pain this date  But tender still around IP of thumb  Opposition to 2nd fold of 5th   Dexterity report did okay with thumb <> fingers act  And retrieving objects out of palm   but had pain with just doing those light functional act - but using thumb   Pt to hold off on any PROM , strengthening , and blocked AROM   And only do    Pain free AROM for opposition to base of 5th  AROM for thumb PA and RA  and then functional strength-   OT discuss  Per pt request - worried about tasks at work    in length with pt about modifications and adjustments for work activities  - suction , diaper changes, rolling , transfers  , NG tube feedings , computer and writing   Pt to return in 10 days - can phone if any issues occur prior                  OT Education - 05/31/17 1733    Education provided  Yes    Education Details  modifications of work activities     [employer]) Educated  Patient    Methods  Explanation;Demonstration;Tactile cues    Comprehension  Returned demonstration;Verbalized understanding       OT Short Term Goals - 05/08/17 1145      OT SHORT TERM GOAL #1  Title  Pain on PRWHE improve with 20 points     Baseline  Pain at eval on PRWHE is 33/50    Time  3    Period  Weeks    Status  New    Target Date  05/29/17      OT SHORT TERM GOAL #2   Title  R thumb AROM improve for pt to touch palm to retrieve  1-2 cm objects out of palm     Baseline  see flowsheet for AROM of thumb     Time  3    Period  Weeks    Status  New    Target Date  05/29/17      OT SHORT TERM GOAL #3   Title  Pt to be ind in HEP to decrease edema and pain - and increase ROM to return to work     Baseline  no knowledge on HEP - edema increase 1 cm     Time  3    Period  Weeks    Status  New    Target Date  05/29/17        OT Long Term Goals - 05/08/17 1147      OT LONG TERM GOAL #1   Title  AROM WFL  and pain decrease for pt to initiate strengthening to be able to do buttons, tie shoes and do hair    Baseline  pain increase 6/10 , AROM decrease - and not able to put pressure on thumb     Time  4    Period  Weeks    Status  New    Target Date  06/05/17      OT LONG TERM GOAL #2   Title  R prehension and grip strength increase to more than 50% compare to L hand     Baseline  NA yet- 6  wks -only 1 wk out of thumb spica -and pain increase 6/10 with pressure or use     Time  5    Period  Weeks    Status  New    Target Date  06/12/17            Plan - 05/31/17 1735    Clinical Impression Statement  Pt arrive with no pain in thumb - pt report she started steriod yesterday - she had pain even doing FMC/dexterity act over the weekend - pt report she had more strength she felt with pain less - to turn key and lift objects- pt to return to work - discuss in length work modifications - and to work on functional strength - and no formal hand therapy this next week while returning to work  - on top of using it - will reassess in 10 days     Occupational performance deficits (Please refer to evaluation for details):  ADL's;IADL's;Work;Play;Leisure    Rehab Potential  Good    OT Frequency  Biweekly    OT Duration  4 weeks    OT Treatment/Interventions  Self-care/ADL training;Fluidtherapy;Splinting;Contrast Bath;Therapeutic exercise;Ultrasound;Paraffin;Manual Therapy;Passive range of motion    Plan  Assess how she did at work - progress and pain     Clinical Decision Making  Several treatment options, min-mod task modification necessary    OT Home Exercise Plan  see pt instruction     Consulted and Agree with Plan of Care  Patient       Patient will benefit from skilled therapeutic intervention in order to improve the  following deficits and impairments:  Pain, Impaired flexibility, Increased edema, Decreased coordination, Decreased strength, Decreased range of motion, Impaired UE functional use  Visit Diagnosis: Pain in right hand  Localized edema  Stiffness of right hand, not elsewhere classified  Muscle weakness (generalized)    Problem List Patient Active Problem List   Diagnosis Date Noted  . Preventative health care 11/25/2015  . Rash and nonspecific skin eruption 08/26/2015  . Obesity (BMI 35.0-39.9 without comorbidity) 08/26/2015    Rosalyn Gess  OTR/L,CLT 05/31/2017, 5:38 PM  [place] [facility] [address] [place], [address] Phone: [phone]   Fax:  [phone]  Name: Korina Tretter MRN: [id number] Date of Birth: 07-Sep-1970

## 2017-06-12 ENCOUNTER — Ambulatory Visit: Payer: Self-pay | Admitting: Occupational Therapy

## 2017-06-12 DIAGNOSIS — M6281 Muscle weakness (generalized): Secondary | ICD-10-CM

## 2017-06-12 DIAGNOSIS — M79641 Pain in right hand: Secondary | ICD-10-CM

## 2017-06-12 DIAGNOSIS — M25641 Stiffness of right hand, not elsewhere classified: Secondary | ICD-10-CM

## 2017-06-12 DIAGNOSIS — R600 Localized edema: Secondary | ICD-10-CM

## 2017-06-12 NOTE — Therapy (Signed)
[place] [facility] [address] [place], [address] Phone: [phone]   Fax:  [phone]  Occupational Therapy Treatment  Patient Details  Name: Alicia Barton MRN: [id number] Date of Birth: 1971-04-22 Referring Provider: Rudene Christians   Encounter Date: 06/12/2017  OT End of Session - 06/12/17 1751    Visit Number  6    Number of Visits  7    Date for OT Re-Evaluation  07/10/17    OT Start Time  1010    OT Stop Time  1044    OT Time Calculation (min)  34 min    Activity Tolerance  Patient tolerated treatment well    Behavior During Therapy  [facility] for tasks assessed/performed       Past Medical History:  Diagnosis Date  . Genital warts   . GERD (gastroesophageal reflux disease)   . Hyperlipidemia   . Obesity (BMI 35.0-39.9 without comorbidity)     Past Surgical History:  Procedure Laterality Date  . ABDOMINAL SURGERY     ileus  . CESAREAN SECTION    . CYST REMOVAL LEG     R lower  . R arm pinched nerve       There were no vitals filed for this visit.  Subjective Assessment - 06/12/17 1015    Subjective   It is better than 2 wks ago - did see pt on Friday  for 9 hrs - little one had to change traheo -and write notes - was some soreness -and then I am using my hand at home normally even - if it hurts - did my daughter hair     Patient Stated Goals  I want to get the use of my R thumb back so I can go back to work , do buttons, tie shoes, do my and my daughters hair, pick up or lift objects     Currently in Pain?  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 after writing , doing tracheo cleaning and changing  But not more than 3/10  Pt to keep pain under 2-3/10   assess AROM for thumb and grip /prehension strength  See flow sheet  Great progress in grip  Prehension less than 50% compare to R - but held off still on putty   pt work load will increase- pt to work on functional strength for prehension  And cont to  increase AROM or thumb IP and Opposition   but not PROM - only AROM -otherwise her pain increase                  OT Education - 06/12/17 1748    Education provided  Yes    Education Details  cont functional strength - and increase act - and discuss progress    Person(s) Educated  Patient    Methods  Explanation    Comprehension  Verbalized understanding;Returned demonstration;Verbal cues required       OT Short Term Goals - 06/12/17 1756      OT SHORT TERM GOAL #1   Title  Pain on PRWHE improve with 20 points     Baseline  Pain at eval on PRWHE is 33/50 and now pain decrease at the worse 3/10 - will do PRWHE next visit     Time  3    Period  Weeks    Status  On-going    Target Date  06/05/17      OT SHORT TERM GOAL #2   Title  R thumb AROM improve for pt to touch palm to retrieve  1-2 cm objects out of palm     Baseline  Thumb IP flexion improving - opposition to 2nd fold of 5th     Time  3    Period  Weeks    Status  On-going    Target Date  07/03/17      OT SHORT TERM GOAL #3   Title  Pt to be ind in HEP to decrease edema and pain - and increase ROM to return to work     Status  Achieved        OT Long Term Goals - 06/12/17 1758      OT LONG TERM GOAL #1   Title  AROM WFL  and pain decrease for pt to initiate  strengthening to be able to do buttons, tie shoes and do hair    Status  Achieved      OT LONG TERM GOAL #2   Title  R prehension and grip strength increase to more than 50% compare to L hand     Baseline  Grip improving greatly - see flowsheet - but prehension less than 50% compare to  L hand     Time  4    Period  Weeks    Status  On-going    Target Date  07/10/17            Plan - 06/12/17 1752    Clinical Impression Statement  Pt was seen 2 wks ago - pt cont with HEP for AROM for thumb IP , MP flexion and opposition - and functional strength  - pt show great progress in grip strength- prehension is significantly decrease  but because pt is returning to work - would at this time recommend doing funtional strength without increaseing pain - and will reassess pt in 3-4 wks - pt report she slowly returning back to work and strength in coming along - still ache after using thumb - but keeping pain under 2-3/10     Occupational performance deficits (Please refer to evaluation for details):  ADL's;IADL's;Work;Play;Leisure    Rehab Potential  Good    OT Frequency  -- in 3-4 wks follow up     OT Duration  4 weeks    OT Treatment/Interventions  Self-care/ADL training;Fluidtherapy;Splinting;Contrast Bath;Therapeutic exercise;Ultrasound;Paraffin;Manual Therapy;Passive range of motion    Plan  Assess how she did with increase work load and progress in prehension strenght and ROM of thumb     Clinical Decision Making  Several treatment options, min-mod task modification necessary    OT Home Exercise Plan  see pt instruction     Consulted and Agree with Plan of Care  Patient       Patient will benefit from skilled therapeutic intervention in order to improve the following deficits and impairments:  Pain, Impaired flexibility, Increased edema, Decreased coordination, Decreased strength, Decreased range of motion, Impaired UE functional use  Visit Diagnosis: Pain in right hand  Localized  edema  Stiffness of right hand, not elsewhere classified  Muscle weakness (generalized)    Problem List Patient Active Problem List   Diagnosis Date Noted  . Preventative health care 11/25/2015  . Rash and nonspecific skin eruption 08/26/2015  . Obesity (BMI 35.0-39.9 without comorbidity) 08/26/2015    Rosalyn Gess OTR/L,CLT 06/12/2017, 6:00 PM  [place] [address], [place], [address] Phone: [phone]   Fax:  [phone]  Name: Alicia Barton MRN: [id number] Date of Birth: [birth year]

## 2017-06-12 NOTE — Patient Instructions (Signed)
Cont with functional strength -with focus on act with lat and 3 point grip  Cont to work on AROM of IP thumb flexion and AROM opposition to base of 5th   Keep pain under 2/10

## 2017-07-03 ENCOUNTER — Ambulatory Visit: Payer: Self-pay | Attending: Orthopedic Surgery | Admitting: Occupational Therapy

## 2017-07-03 DIAGNOSIS — R600 Localized edema: Secondary | ICD-10-CM

## 2017-07-03 DIAGNOSIS — M6281 Muscle weakness (generalized): Secondary | ICD-10-CM

## 2017-07-03 DIAGNOSIS — M25641 Stiffness of right hand, not elsewhere classified: Secondary | ICD-10-CM

## 2017-07-03 DIAGNOSIS — M79641 Pain in right hand: Secondary | ICD-10-CM

## 2017-07-03 NOTE — Patient Instructions (Signed)
Cont using hand and increase functional strength for 3 point grip

## 2017-07-03 NOTE — Therapy (Signed)
[facility] [address] [place], [address] Phone: [phone]   Fax:  [phone]  Occupational Therapy Treatment/discharge   Patient Details  Name: Haylee Mcanany MRN: [id number] Date of Birth: Jun 10, 1970 Referring Provider: Rudene Christians   Encounter Date: 07/03/2017  OT End of Session - 07/03/17 1712    Visit Number  7    Number of Visits  7    Date for OT Re-Evaluation  07/03/17    OT Start Time  1601    OT Stop Time  1631    OT Time Calculation (min)  30 min    Activity Tolerance  Patient tolerated treatment well    Behavior During Therapy  [facility] for tasks assessed/performed       Past Medical History:  Diagnosis Date  . Genital warts   . GERD (gastroesophageal reflux disease)   . Hyperlipidemia   . Obesity (BMI 35.0-39.9 without comorbidity)     Past Surgical History:  Procedure Laterality Date  . ABDOMINAL SURGERY     ileus  . CESAREAN SECTION    . CYST REMOVAL LEG     R lower  . R arm pinched nerve       There were no vitals filed for this visit.  Subjective Assessment - 07/03/17 1710    Subjective   I seen you about 3 wks ago - I am using my hand about 95% and strength maybe about 90% - still pain at times with certain movement with pinch or thumb but not more than 3/10     Patient Stated Goals  I want to get the use of my R thumb back so I can go back to work , do buttons, tie shoes, do my and my daughters hair, pick up or lift objects     Currently in Pain?  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  Doing okay with suction , change and rolling  Min trouble with G tube - but can do it  And pushing buttons for remote still little tough Can open jars, cut food Writing doing okay   grip and prehension assess - see flowsheet   and AROM for R thumb   still not hyper extention of IP - but can take time - do not know if will get back - she can do gentle stretch if not increase pain                 OT Education -  07/03/17 1711    Education provided  Yes    Education Details  discharge with cont functional strengthening     Person(s) Educated  Patient    Methods  Explanation    Comprehension  Verbalized understanding       OT Short Term Goals - 07/03/17 1715      OT SHORT TERM GOAL #1   Title  Pain on PRWHE improve with 20 points     Baseline  Pain at eval on PRWHE is 33/50 and now 7/50    Status  Achieved      OT SHORT TERM GOAL #2   Title  R thumb AROM improve for pt to touch palm to retrieve  1-2 cm objects out of palm     Status  Achieved      OT SHORT TERM GOAL #3   Title  Pt to be ind in HEP to decrease edema and pain - and increase ROM to return to work     Status  Achieved        OT Long Term Goals - 07/03/17 1716      OT LONG TERM GOAL #1   Title  AROM WFL  and pain decrease for pt to initiate strengthening to be able to do buttons, tie shoes and do hair    Status  Achieved      OT LONG TERM GOAL #2   Title  R prehension and grip strength increase to more than 50% compare to L hand     Baseline  Grip in R 90, L 85    Status  Achieved            Plan - 07/03/17 1712    Clinical Impression Statement  Pt was seen about 3 wks ago - and pt cont at home with functional strength for thumb  - and AROM for thumb IP flexion and opposition - pt show this date great  improvement in grip and lat grip strenght- with no pain - but 3 point still decrease and some pain - 3/10 - when taped the IP - 3 point pinch increase to 9 lbs - pt to cont with functional strength and AROM - just over 3 months since injury no - can take about 6 months to year to return to prior level - it still having pain at 6 months maybe need to see  MD - otherwise discharge at this time     OT Treatment/Interventions  Self-care/ADL training;Fluidtherapy;Splinting;Contrast Bath;Therapeutic exercise;Ultrasound;Paraffin;Manual Therapy;Passive range of motion    Plan  cont functional strengthening     Clinical Decision Making  Limited treatment options, no task modification necessary    OT Home Exercise Plan  see pt instruction     Consulted and Agree with Plan of Care  Patient       Patient will benefit from skilled therapeutic intervention in order to improve the following deficits and impairments:     Visit Diagnosis: Pain in right hand  Localized edema  Stiffness of right hand, not elsewhere classified  Muscle weakness (generalized)    Problem List Patient Active Problem List   Diagnosis Date Noted  . Preventative health care 11/25/2015  . Rash and nonspecific skin eruption 08/26/2015  . Obesity (BMI 35.0-39.9 without comorbidity) 08/26/2015    Rosalyn Gess OTR/L,CLT 07/03/2017, 5:17 PM  [facility] [address], [place], [address] Phone: [phone]   Fax:  [phone]  Name:  Romonda Parker MRN: [id number] Date of Birth: May 16, 1970

## 2017-10-12 ENCOUNTER — Other Ambulatory Visit: Payer: Self-pay

## 2017-10-12 ENCOUNTER — Emergency Department
Admission: EM | Admit: 2017-10-12 | Discharge: 2017-10-12 | Disposition: A | Discharge status: Home / Self Care | Payer: Self-pay | Attending: Emergency Medicine | Admitting: Emergency Medicine

## 2017-10-12 ENCOUNTER — Encounter: Payer: Self-pay | Admitting: Emergency Medicine

## 2017-10-12 DIAGNOSIS — J209 Acute bronchitis, unspecified: Secondary | ICD-10-CM | POA: Insufficient documentation

## 2017-10-12 MED ORDER — PREDNISONE 10 MG PO TABS: ORAL_TABLET | ORAL | 0 refills | Status: DC

## 2017-10-12 MED ORDER — ALBUTEROL SULFATE HFA 108 (90 BASE) MCG/ACT IN AERS: 2.0000 | INHALATION_SPRAY | Freq: Four times a day (QID) | RESPIRATORY_TRACT | 2 refills | Status: AC | PRN

## 2017-10-12 MED ORDER — GUAIFENESIN-CODEINE 100-10 MG/5ML PO SOLN: 5.0000 mL | ORAL | 0 refills | Status: DC | PRN

## 2017-10-12 MED ORDER — SULFAMETHOXAZOLE-TRIMETHOPRIM 800-160 MG PO TABS: 1.0000 | ORAL_TABLET | Freq: Two times a day (BID) | ORAL | 0 refills | Status: DC

## 2017-10-12 MED ORDER — IPRATROPIUM-ALBUTEROL 0.5-2.5 (3) MG/3ML IN SOLN
3.0000 mL | Freq: Once | RESPIRATORY_TRACT | Status: AC
  Administered 2017-10-12: 3 mL via RESPIRATORY_TRACT
  Filled 2017-10-12: qty 3

## 2017-10-12 NOTE — ED Triage Notes (Signed)
Pt reports that she has had a productive cough for the last week with nasal congestion. Denies fevers. Is able to speak in complete sentences.

## 2017-10-12 NOTE — ED Provider Notes (Signed)
[facility] Emergency Department Provider Note   ____________________________________________   First MD Initiated Contact with Patient 10/12/17 1552     (approximate)  I have reviewed the triage vital signs and the nursing notes.   HISTORY  Chief Complaint Cough and Nasal Congestion   HPI Alicia Barton is a 47 y.o. female is here with complaint of productive cough and nasal congestion for 1 week.  Patient has tried over-the-counter medications without any relief.  She denies any fever or chills.  She states the cough has kept her up most of the night.  Patient has had a history of bronchitis in the past and had an inhaler that was left over from that time which she is used and has helped somewhat.  Patient is a non-smoker.  Past Medical History:  Diagnosis Date  . Genital warts   . GERD (gastroesophageal reflux disease)   . Hyperlipidemia   . Obesity (BMI 35.0-39.9 without comorbidity)     Patient Active Problem List   Diagnosis Date Noted  . Preventative health care 11/25/2015  . Rash and nonspecific skin eruption 08/26/2015  . Obesity (BMI 35.0-39.9 without comorbidity) 08/26/2015    Past Surgical History:  Procedure Laterality Date  . ABDOMINAL SURGERY     ileus  . CESAREAN SECTION    . CYST REMOVAL LEG     R lower  . R arm pinched nerve       Prior to Admission medications   Medication Sig Start Date End Date Taking? Authorizing Provider  albuterol (PROVENTIL HFA;VENTOLIN HFA) 108 (90 Base) MCG/ACT inhaler Inhale 2 puffs into the lungs every 6 (six) hours as needed for wheezing or shortness of breath. 10/12/17   Letitia Neri L, PA-C  guaiFENesin-codeine 100-10 MG/5ML syrup Take 5 mLs by mouth every 4 (four) hours as needed. 10/12/17   Johnn Hai, PA-C  predniSONE (DELTASONE) 10 MG tablet Take 3 tablets once a day for 5 days 10/12/17   Johnn Hai, PA-C  sulfamethoxazole-trimethoprim (BACTRIM DS,SEPTRA DS) 800-160 MG tablet  Take 1 tablet by mouth 2 (two) times daily. 10/12/17   Johnn Hai, PA-C    Allergies Patient has no known allergies.  Family History  Problem Relation Age of Onset  . Arthritis Mother   . Hyperlipidemia Mother   . Diabetes Mother   . Emphysema Father     Social History Social History   Tobacco Use  . Smoking status: Never Smoker  Substance Use Topics  . Alcohol use: Yes    Alcohol/week: 0.0 oz    Comment: socially  . Drug use: No    Review of Systems Constitutional: No fever/chills Eyes: No visual changes. ENT: No sore throat.  Negative for ear pain. Cardiovascular: Denies chest pain. Respiratory: Denies shortness of breath.  Positive for productive cough. Gastrointestinal: No abdominal pain.  No nausea, no vomiting.   Musculoskeletal: Negative for back pain. Skin: Negative for rash. Neurological: Negative for headaches, focal weakness or numbness. ___________________________________________   PHYSICAL EXAM:  VITAL SIGNS: ED Triage Vitals  Enc Vitals Group     BP 10/12/17 1538 127/83     Pulse Rate 10/12/17 1538 77     Resp 10/12/17 1538 20     Temp 10/12/17 1538 98.5 F (36.9 C)     Temp Source 10/12/17 1538 Oral     SpO2 10/12/17 1538 97 %     Weight 10/12/17 1539 230 lb (104.3 kg)     Height 10/12/17 1539  5' 6"  (1.676 m)     Head Circumference --      Peak Flow --      Pain Score 10/12/17 1539 4     Pain Loc --      Pain Edu? --      Excl. in [place]? --    Constitutional: Alert and oriented. Well appearing and in no acute distress. Eyes: Conjunctivae are normal.  Head: Atraumatic. Nose: No congestion/rhinnorhea.  EACs are clear and TMs are dull bilaterally. Mouth/Throat: Mucous membranes are moist.  Oropharynx non-erythematous. Neck: No stridor.   Hematological/Lymphatic/Immunilogical: No cervical lymphadenopathy. Cardiovascular: Normal rate, regular rhythm. Grossly normal heart sounds.  Good peripheral circulation. Respiratory: Normal  respiratory effort.  No retractions. Lungs CTAB.  Productive cough is present.  No rales or rhonchi noted.  No wheezing. Gastrointestinal: Soft and nontender. No distention. No abdominal bruits. No CVA tenderness. Musculoskeletal: No lower extremity tenderness nor edema.  No joint effusions. Neurologic:  Normal speech and language. No gross focal neurologic deficits are appreciated. No gait instability. Skin:  Skin is warm, dry and intact. No rash noted. Psychiatric: Mood and affect are normal. Speech and behavior are normal.  ____________________________________________   LABS (all labs ordered are listed, but only abnormal results are displayed)  Labs Reviewed - No data to display   PROCEDURES  Procedure(s) performed: None  Procedures  Critical Care performed: No  ____________________________________________   INITIAL IMPRESSION / ASSESSMENT AND PLAN / ED COURSE  As part of my medical decision making, I reviewed the following data within the electronic MEDICAL RECORD NUMBER Notes from prior ED visits and [place] Controlled Substance Database  Patient was given a DuoNeb treatment while in the emergency department and when reassessed coughing had improved.  Patient has an old albuterol inhaler at home but does not know if it is expired.  She was given a prescription for new one if she discovers that that one has expired.  She was discharged with prescription for Robitussin AC as needed for cough, prednisone 3 tablets once a day for the next 5 days and Bactrim DS twice daily for 10 days.  ____________________________________________   FINAL CLINICAL IMPRESSION(S) / ED DIAGNOSES  Final diagnoses:  Acute bronchitis, unspecified organism     ED Discharge Orders        Ordered    albuterol (PROVENTIL HFA;VENTOLIN HFA) 108 (90 Base) MCG/ACT inhaler  Every 6 hours PRN     10/12/17 1622    predniSONE (DELTASONE) 10 MG tablet     10/12/17 1624    sulfamethoxazole-trimethoprim (BACTRIM  DS,SEPTRA DS) 800-160 MG tablet  2 times daily     10/12/17 1624    guaiFENesin-codeine 100-10 MG/5ML syrup  Every 4 hours PRN     10/12/17 1624       Note:  This document was prepared using Dragon voice recognition software and may include unintentional dictation errors.    Johnn Hai, PA-C 10/12/17 1641    Earleen [place], MD 10/13/17 [phone]

## 2017-10-12 NOTE — Discharge Instructions (Addendum)
Follow-up with your primary care provider if any continued problems.  Begin with guaifenesin with codeine as needed for cough, prednisone 3 tablets once a day for the next 5 days and Bactrim DS twice daily for 10 days.  There is a prescription for an albuterol inhaler if you get home and find that your inhaler has expired.  Increase fluids.

## 2018-06-19 ENCOUNTER — Encounter: Payer: Self-pay | Admitting: Obstetrics & Gynecology

## 2018-06-19 ENCOUNTER — Ambulatory Visit (INDEPENDENT_AMBULATORY_CARE_PROVIDER_SITE_OTHER): Payer: Commercial Managed Care - PPO | Admitting: Obstetrics & Gynecology

## 2018-06-19 VITALS — BP 93/65 | HR 72 | Wt 236.2 lb

## 2018-06-19 DIAGNOSIS — Z1231 Encounter for screening mammogram for malignant neoplasm of breast: Secondary | ICD-10-CM

## 2018-06-19 DIAGNOSIS — Z113 Encounter for screening for infections with a predominantly sexual mode of transmission: Secondary | ICD-10-CM | POA: Diagnosis not present

## 2018-06-19 DIAGNOSIS — N898 Other specified noninflammatory disorders of vagina: Secondary | ICD-10-CM

## 2018-06-19 DIAGNOSIS — Z124 Encounter for screening for malignant neoplasm of cervix: Secondary | ICD-10-CM

## 2018-06-19 DIAGNOSIS — B373 Candidiasis of vulva and vagina: Secondary | ICD-10-CM

## 2018-06-19 DIAGNOSIS — Z1151 Encounter for screening for human papillomavirus (HPV): Secondary | ICD-10-CM

## 2018-06-19 DIAGNOSIS — Z01419 Encounter for gynecological examination (general) (routine) without abnormal findings: Secondary | ICD-10-CM

## 2018-06-19 DIAGNOSIS — B3731 Acute candidiasis of vulva and vagina: Secondary | ICD-10-CM

## 2018-06-19 NOTE — Patient Instructions (Signed)
Preventive Care 40-64 Years, Female Preventive care refers to lifestyle choices and visits with your health care provider that can promote health and wellness. What does preventive care include?   A yearly physical exam. This is also called an annual well check.  Dental exams once or twice a year.  Routine eye exams. Ask your health care provider how often you should have your eyes checked.  Personal lifestyle choices, including: ? Daily care of your teeth and gums. ? Regular physical activity. ? Eating a healthy diet. ? Avoiding tobacco and drug use. ? Limiting alcohol use. ? Practicing safe sex. ? Taking low-dose aspirin daily starting at age 50. ? Taking vitamin and mineral supplements as recommended by your health care provider. What happens during an annual well check? The services and screenings done by your health care provider during your annual well check will depend on your age, overall health, lifestyle risk factors, and family history of disease. Counseling Your health care provider may ask you questions about your:  Alcohol use.  Tobacco use.  Drug use.  Emotional well-being.  Home and relationship well-being.  Sexual activity.  Eating habits.  Work and work environment.  Method of birth control.  Menstrual cycle.  Pregnancy history. Screening You may have the following tests or measurements:  Height, weight, and BMI.  Blood pressure.  Lipid and cholesterol levels. These may be checked every 5 years, or more frequently if you are over 50 years old.  Skin check.  Lung cancer screening. You may have this screening every year starting at age 55 if you have a 30-pack-year history of smoking and currently smoke or have quit within the past 15 years.  Colorectal cancer screening. All adults should have this screening starting at age 50 and continuing until age 75. Your health care provider may recommend screening at age 45. You will have tests every  1-10 years, depending on your results and the type of screening test. People at increased risk should start screening at an earlier age. Screening tests may include: ? Guaiac-based fecal occult blood testing. ? Fecal immunochemical test (FIT). ? Stool DNA test. ? Virtual colonoscopy. ? Sigmoidoscopy. During this test, a flexible tube with a tiny camera (sigmoidoscope) is used to examine your rectum and lower colon. The sigmoidoscope is inserted through your anus into your rectum and lower colon. ? Colonoscopy. During this test, a long, thin, flexible tube with a tiny camera (colonoscope) is used to examine your entire colon and rectum.  Hepatitis C blood test.  Hepatitis B blood test.  Sexually transmitted disease (STD) testing.  Diabetes screening. This is done by checking your blood sugar (glucose) after you have not eaten for a while (fasting). You may have this done every 1-3 years.  Mammogram. This may be done every 1-2 years. Talk to your health care provider about when you should start having regular mammograms. This may depend on whether you have a family history of breast cancer.  BRCA-related cancer screening. This may be done if you have a family history of breast, ovarian, tubal, or peritoneal cancers.  Pelvic exam and Pap test. This may be done every 3 years starting at age 21. Starting at age 30, this may be done every 5 years if you have a Pap test in combination with an HPV test.  Bone density scan. This is done to screen for osteoporosis. You may have this scan if you are at high risk for osteoporosis. Discuss your test results, treatment options,   and if necessary, the need for more tests with your health care provider. Vaccines Your health care provider may recommend certain vaccines, such as:  Influenza vaccine. This is recommended every year.  Tetanus, diphtheria, and acellular pertussis (Tdap, Td) vaccine. You may need a Td booster every 10 years.  Varicella  vaccine. You may need this if you have not been vaccinated.  Zoster vaccine. You may need this after age 38.  Measles, mumps, and rubella (MMR) vaccine. You may need at least one dose of MMR if you were born in 1957 or later. You may also need a second dose.  Pneumococcal 13-valent conjugate (PCV13) vaccine. You may need this if you have certain conditions and were not previously vaccinated.  Pneumococcal polysaccharide (PPSV23) vaccine. You may need one or two doses if you smoke cigarettes or if you have certain conditions.  Meningococcal vaccine. You may need this if you have certain conditions.  Hepatitis A vaccine. You may need this if you have certain conditions or if you travel or work in places where you may be exposed to hepatitis A.  Hepatitis B vaccine. You may need this if you have certain conditions or if you travel or work in places where you may be exposed to hepatitis B.  Haemophilus influenzae type b (Hib) vaccine. You may need this if you have certain conditions. Talk to your health care provider about which screenings and vaccines you need and how often you need them. This information is not intended to replace advice given to you by your health care provider. Make sure you discuss any questions you have with your health care provider. Document Released: 05/08/2015 Document Revised: 06/01/2017 Document Reviewed: 02/10/2015 Elsevier Interactive Patient Education  2019 [employer].

## 2018-06-19 NOTE — Progress Notes (Deleted)
Her cycle has been abnormal at times. ggg

## 2018-06-19 NOTE — Progress Notes (Signed)
GYNECOLOGY ANNUAL PREVENTATIVE CARE ENCOUNTER NOTE  History:     Alicia Barton is a 48 y.o. [phone] female here to establish gynecologic care and for a routine annual gynecologic exam.  Current complaints: none. Desires annual vaginal STI screen, no blood testing.   Denies abnormal vaginal bleeding, discharge, pelvic pain, problems with intercourse or other gynecologic concerns.    Gynecologic History Patient's last menstrual period was 06/09/2018 (approximate). Contraception: abstinence (rarely sexually active with husband) Last Pap: 2018. Results were: normal with negative HPV Last mammogram: 2018. Results were: normal  Obstetric History OB History  Gravida Para Term Preterm AB Living  3 2 2   1 2   SAB TAB Ectopic Multiple Live Births  1       2    # Outcome Date GA Lbr Len/2nd Weight Sex Delivery Anes PTL Lv  3 Term 2014     CS-Unspec   LIV  2 Term 1998     Vag-Spont   LIV  1 SAB             Past Medical History:  Diagnosis Date  . Genital warts   . GERD (gastroesophageal reflux disease)   . Obesity (BMI 35.0-39.9 without comorbidity)     Past Surgical History:  Procedure Laterality Date  . CESAREAN SECTION    . CYST REMOVAL LEG     R lower  . DIAGNOSTIC LAPAROSCOPY     For postcesarean ileus, no bowel surgery or anything done  . NERVE SURGERY     L arm pinched nerve, released    Current Outpatient Medications on File Prior to Visit  Medication Sig Dispense Refill  . albuterol (PROVENTIL HFA;VENTOLIN HFA) 108 (90 Base) MCG/ACT inhaler Inhale 2 puffs into the lungs every 6 (six) hours as needed for wheezing or shortness of breath. 1 Inhaler 2  . Cholecalciferol (VITAMIN D3) 1.25 MG (50000 UT) CAPS Take 1 capsule by mouth once a week.     No current facility-administered medications on file prior to visit.     No Known Allergies  Social History:  reports that she has never smoked. She has never used smokeless tobacco. She reports current alcohol use. She  reports that she does not use drugs.  Family History  Problem Relation Age of Onset  . Arthritis Mother   . Hyperlipidemia Mother   . Diabetes Mother   . Emphysema Father     The following portions of the patient's history were reviewed and updated as appropriate: allergies, current medications, past family history, past medical history, past social history, past surgical history and problem list.  Review of Systems Pertinent items noted in HPI and remainder of comprehensive ROS otherwise negative.  Physical Exam:  BP 93/65   Pulse 72   Wt 236 lb 3.2 oz (107.1 kg)   LMP 06/09/2018 (Approximate)   BMI 38.12 kg/m  CONSTITUTIONAL: Well-developed, well-nourished female in no acute distress.  HENT:  Normocephalic, atraumatic, External right and left ear normal. Oropharynx is clear and moist EYES: Conjunctivae and EOM are normal. Pupils are equal, round, and reactive to light. No scleral icterus.  NECK: Normal range of motion, supple, no masses.  Normal thyroid.  SKIN: Skin is warm and dry. No rash noted. Not diaphoretic. No erythema. No pallor. MUSCULOSKELETAL: Normal range of motion. No tenderness.  No cyanosis, clubbing, or edema.  2+ distal pulses. NEUROLOGIC: Alert and oriented to person, place, and time. Normal reflexes, muscle tone coordination. No cranial nerve deficit noted. PSYCHIATRIC: Normal  mood and affect. Normal behavior. Normal judgment and thought content. CARDIOVASCULAR: Normal heart rate noted, regular rhythm RESPIRATORY: Clear to auscultation bilaterally. Effort and breath sounds normal, no problems with respiration noted. BREASTS: Symmetric in size. No masses, skin changes, nipple drainage, or lymphadenopathy. ABDOMEN: Soft, obese, normal bowel sounds, no distention noted.  No tenderness, rebound or guarding.  PELVIC: Normal appearing external genitalia.  Area of hypopigmentation noted on left labium majus (patient says it has been there s/p trauma many years ago);  normal appearing vaginal mucosa and cervix.  No abnormal discharge noted.  Pap smear obtained.  Unable to palpate uterus and adnexa due to habitus. No uterine or adnexal tenderness.   Assessment and Plan:    1. Encounter for screening mammogram for breast cancer Mammogram scheduled - MM 3D SCREEN BREAST BILATERAL; Future  2. Well woman exam with routine gynecological exam - Cytology - PAP - Cervicovaginal ancillary only  Will follow up results and manage accordingly. Routine preventative health maintenance measures emphasized. Please refer to After Visit Summary for other counseling recommendations.      Verita Schneiders, MD, [place] for [employer], [place]

## 2018-06-21 LAB — CYTOLOGY - PAP
Diagnosis: NEGATIVE
HPV: NOT DETECTED

## 2018-06-21 LAB — CERVICOVAGINAL ANCILLARY ONLY
BACTERIAL VAGINITIS: NEGATIVE
Candida vaginitis: POSITIVE — AB
Chlamydia: NEGATIVE
Neisseria Gonorrhea: NEGATIVE
TRICH (WINDOWPATH): NEGATIVE

## 2018-06-24 MED ORDER — FLUCONAZOLE 150 MG PO TABS: 150.0000 mg | ORAL_TABLET | Freq: Once | ORAL | 3 refills | Status: AC

## 2018-06-24 NOTE — Addendum Note (Signed)
Addended by: Verita Schneiders A on: 06/24/2018 09:18 PM   Modules accepted: Orders

## 2018-06-27 ENCOUNTER — Ambulatory Visit
Admission: RE | Admit: 2018-06-27 | Discharge: 2018-06-27 | Disposition: A | Discharge status: Home / Self Care | Payer: Commercial Managed Care - PPO | Source: Ambulatory Visit | Attending: Obstetrics & Gynecology | Admitting: Obstetrics & Gynecology

## 2018-06-27 ENCOUNTER — Other Ambulatory Visit: Payer: Self-pay | Admitting: Obstetrics & Gynecology

## 2018-06-27 DIAGNOSIS — Z1231 Encounter for screening mammogram for malignant neoplasm of breast: Secondary | ICD-10-CM | POA: Diagnosis not present

## 2018-06-27 DIAGNOSIS — R928 Other abnormal and inconclusive findings on diagnostic imaging of breast: Secondary | ICD-10-CM

## 2018-07-06 ENCOUNTER — Ambulatory Visit
Admission: RE | Admit: 2018-07-06 | Discharge: 2018-07-06 | Disposition: A | Discharge status: Home / Self Care | Payer: Commercial Managed Care - PPO | Source: Ambulatory Visit | Attending: Obstetrics & Gynecology | Admitting: Obstetrics & Gynecology

## 2018-07-06 ENCOUNTER — Other Ambulatory Visit: Payer: Self-pay

## 2018-07-06 DIAGNOSIS — R928 Other abnormal and inconclusive findings on diagnostic imaging of breast: Secondary | ICD-10-CM

## 2020-12-24 IMAGING — MG DIGITAL DIAGNOSTIC UNILATERAL RIGHT MAMMOGRAM WITH TOMO AND CAD
8 series · 8 of 24 positions shown · non-contrast
Comparison: Previous exam(s).

CLINICAL DATA: Possible right lateral breast distortion seen on
most recent screening mammogram.

EXAM:
DIGITAL DIAGNOSTIC UNILATERAL RIGHT MAMMOGRAM WITH CAD AND TOMO

[R CC synth-2D (1 of 2)]
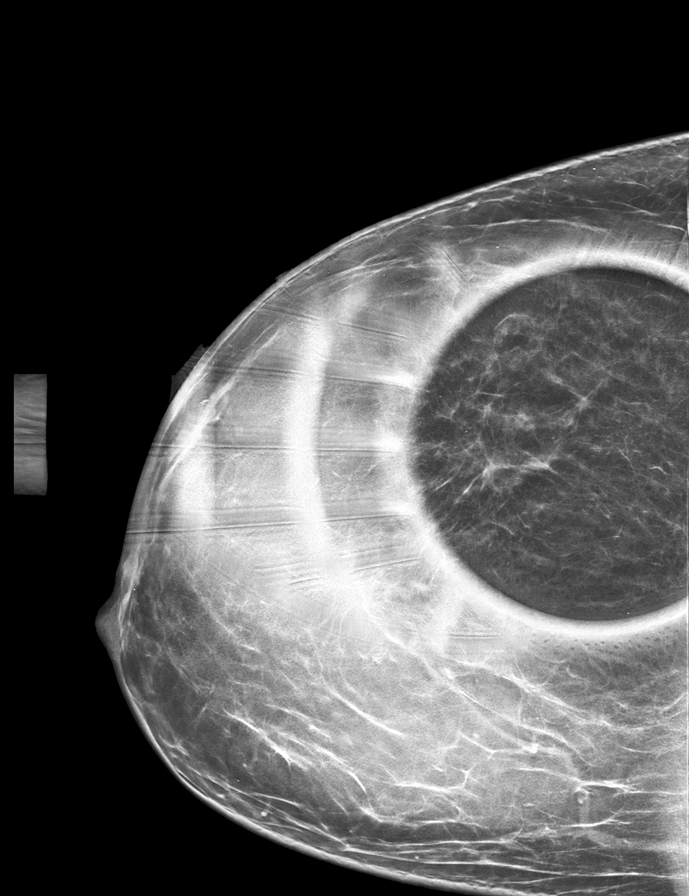

[R ML synth-2D]
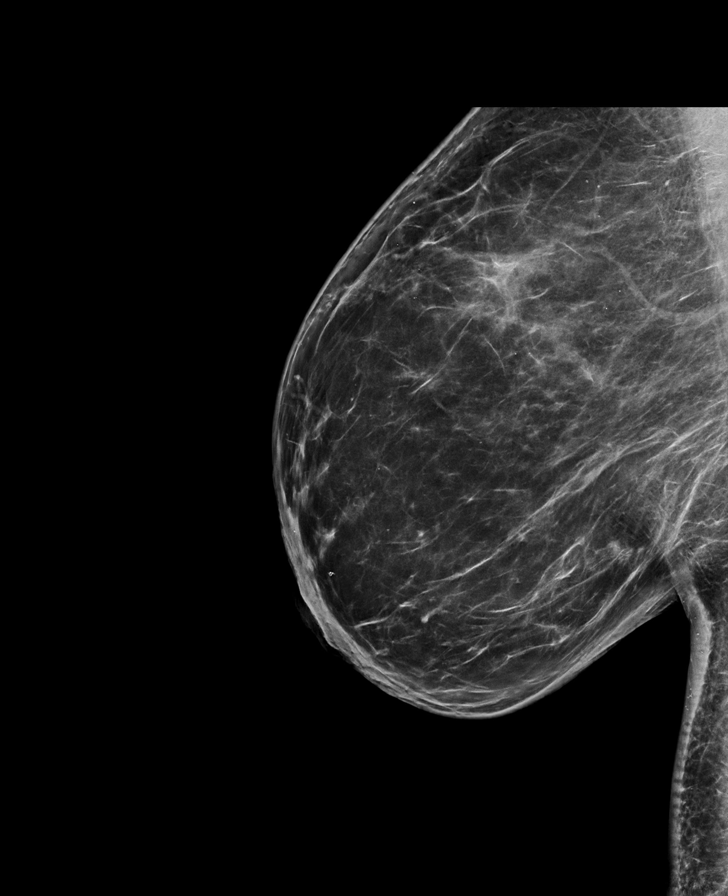

[R CC synth-2D (2 of 2)]
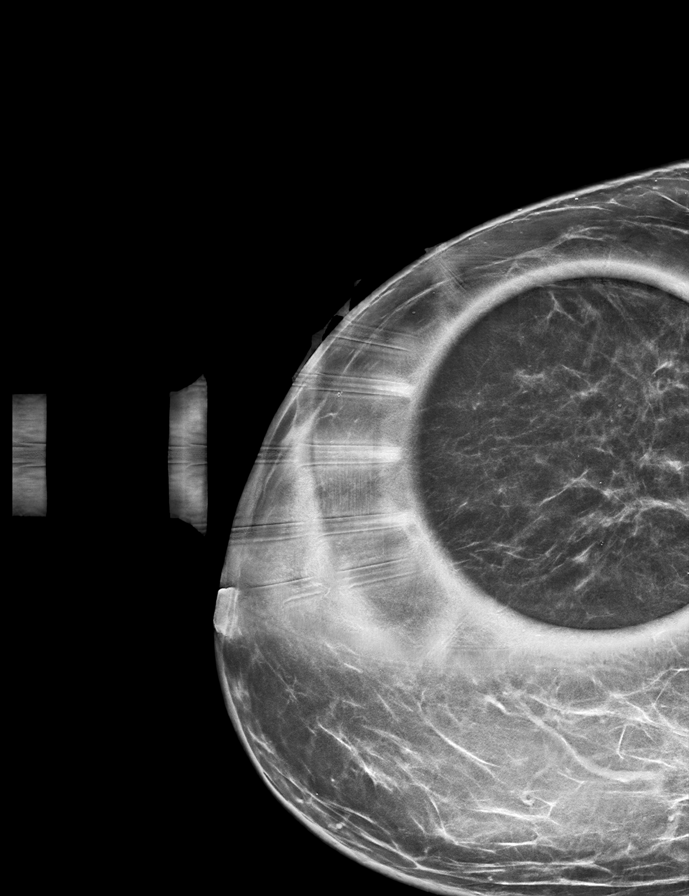

[R XCCL synth-2D]
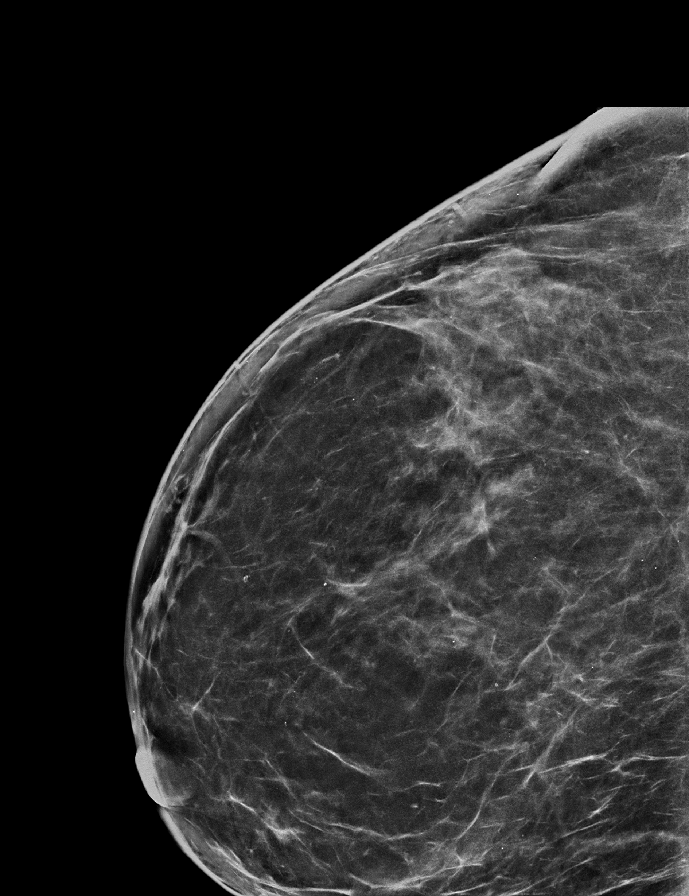

[R ML tomo · tomo slice 47/93.0]
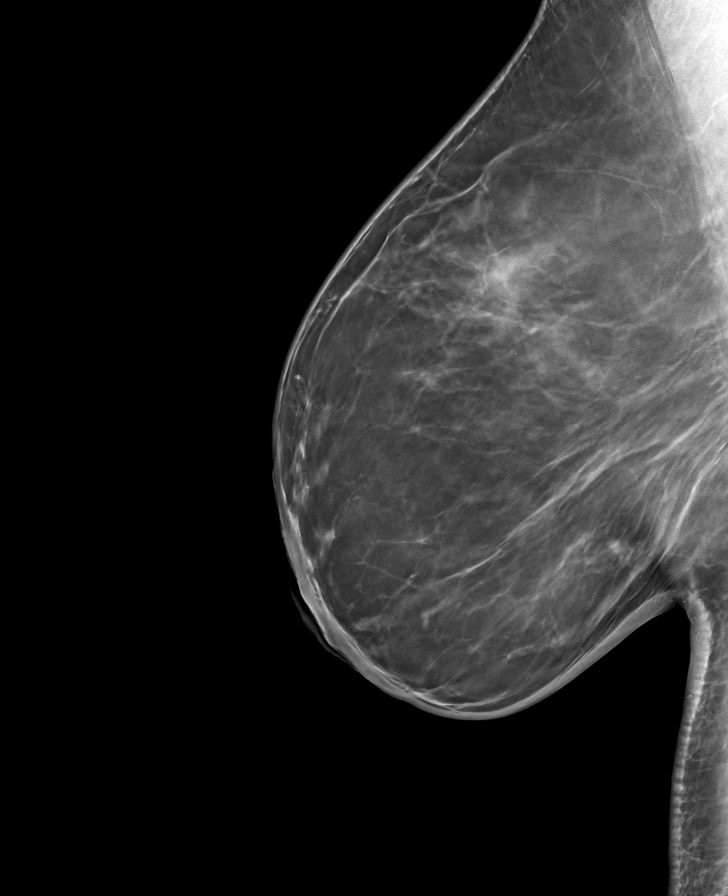

[R CC tomo (1 of 2) · tomo slice 31/62.0]
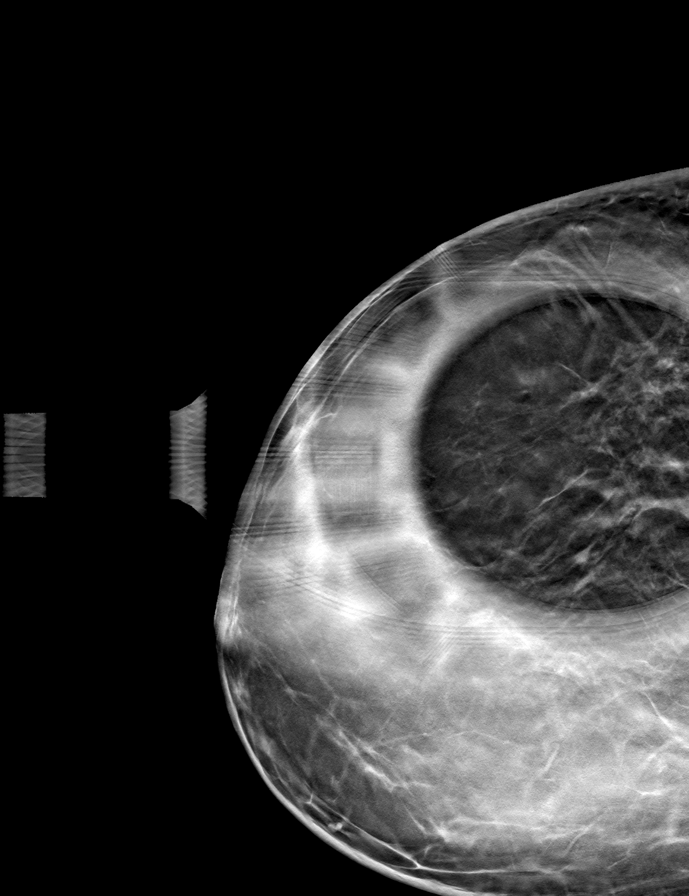

[R CC tomo (2 of 2) · tomo slice 31/61.0]
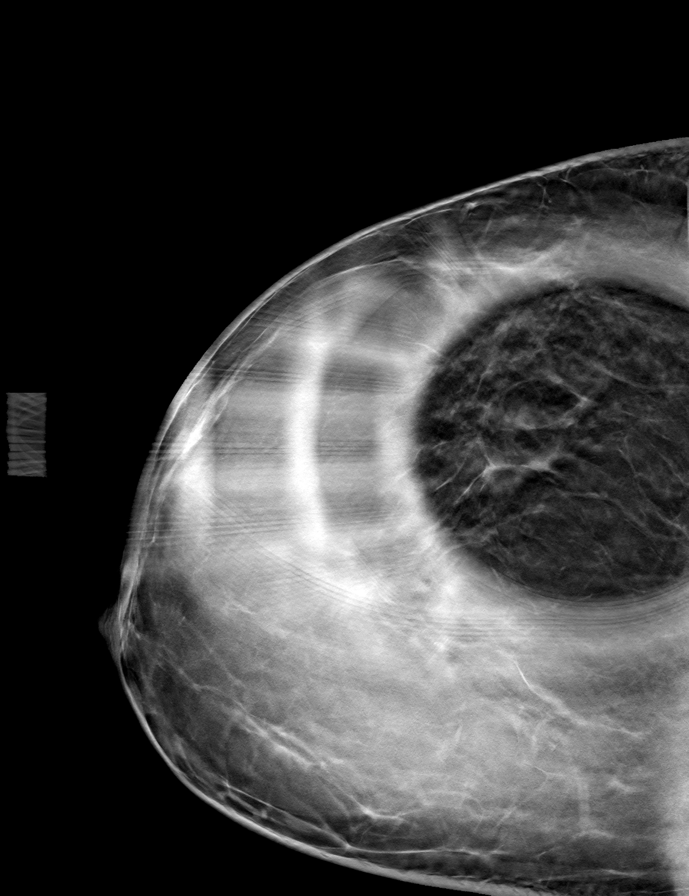

[R XCCL tomo · tomo slice 39/78.0]
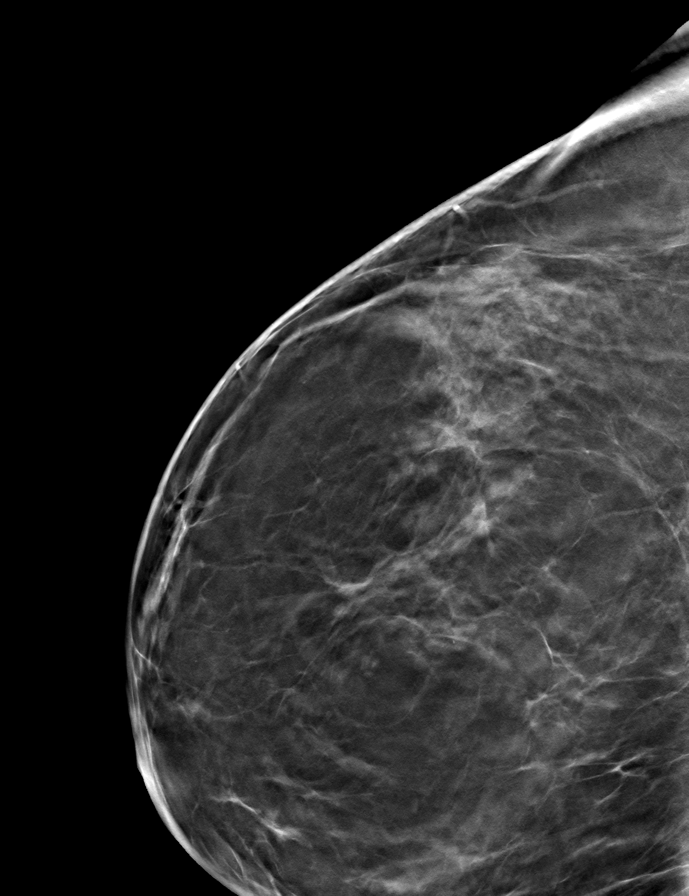

[8 of 24 positions shown; findings below may reference images not displayed]

ACR Breast Density Category b: There are scattered areas of
fibroglandular density.
FINDINGS: Additional full view and spot compression views of the right breast
demonstrate no suspicious masses or areas of architectural
distortion. The previously questioned distortion in the lateral
right breast effaces to glandular tissue.

Mammographic images were processed with CAD.
IMPRESSION: No mammographic evidence of malignancy in the right breast.

RECOMMENDATION:
Screening mammogram in one year.(Code:OC-C-JJE)

I have discussed the findings and recommendations with the patient.
Results were also provided in writing at the conclusion of the
visit. If applicable, a reminder letter will be sent to the patient
regarding the next appointment.

BI-RADS CATEGORY  1: Negative.
